# Patient Record
Sex: Female | Born: 1995 | Race: Black or African American | Hispanic: No | Marital: Single | Smoking: Never smoker
Health system: Southern US, Community
[De-identification: ages and names within clinical notes are randomized; demographics above are authoritative.]

## PROBLEM LIST (undated history)

## (undated) ENCOUNTER — Inpatient Hospital Stay: Payer: Self-pay

## (undated) DIAGNOSIS — R569 Unspecified convulsions: Secondary | ICD-10-CM

## (undated) HISTORY — PX: THROAT SURGERY: SHX803

---
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
## 2017-06-17 ENCOUNTER — Emergency Department (HOSPITAL_COMMUNITY)
Admission: EM | Admit: 2017-06-17 | Discharge: 2017-06-17 | Disposition: A | Discharge status: Home / Self Care | Payer: Self-pay | Attending: Emergency Medicine | Admitting: Emergency Medicine

## 2017-06-17 ENCOUNTER — Encounter (HOSPITAL_COMMUNITY): Payer: Self-pay | Admitting: Emergency Medicine

## 2017-06-17 DIAGNOSIS — R21 Rash and other nonspecific skin eruption: Secondary | ICD-10-CM | POA: Insufficient documentation

## 2017-06-17 DIAGNOSIS — M7918 Myalgia, other site: Secondary | ICD-10-CM | POA: Insufficient documentation

## 2017-06-17 DIAGNOSIS — M791 Myalgia, unspecified site: Secondary | ICD-10-CM

## 2017-06-17 DIAGNOSIS — R197 Diarrhea, unspecified: Secondary | ICD-10-CM | POA: Insufficient documentation

## 2017-06-17 LAB — POC URINE PREG, ED: PREG TEST UR: NEGATIVE

## 2017-06-17 MED ORDER — DOXYCYCLINE HYCLATE 100 MG PO CAPS: 100.0000 mg | ORAL_CAPSULE | Freq: Two times a day (BID) | ORAL | 0 refills | Status: DC

## 2017-06-17 NOTE — ED Triage Notes (Signed)
Pt reports generalized muscle pain since yesterday. Took twice in last 24 hours. Last dosage at 6am. Pt denies NV, loose stool this am. Reports possible fever yesterday. Family at bedside. Pt is alert, oriented and ambulatory

## 2017-06-17 NOTE — ED Provider Notes (Signed)
[facility]-EMERGENCY DEPT Provider Note   CSN: [id number] Arrival date & time: 06/17/17  1153     History   Chief Complaint Chief Complaint  Patient presents with  . Generalized Body Aches    HPI Paula Strickland is a 21 y.o. female who presents today with chief complaint acute onset, progressively worsening generalized myalgias since yesterday morning.  She states that she feels an achy soreness all over her body with worsening at her joints, low back, and neck.  She has been taking ibuprofen at home with mild relief.  Endorses subjective fevers at home but is unsure what her temperature was.  One episode of watery loose stools earlier today but denies melena or hematochezia.  Denies abdominal pain, nausea, vomiting, urinary symptoms, or vaginal symptoms.  Denies chest pain or shortness of breath.  She endorses a mild frontal aching headache but denies vision changes, numbness, tingling, or weakness.  No known sick contacts.  Denies suspicious food intake.  No recent treatment with antibiotics.  She did have an insect bite approximately 2 weeks ago to her left ankle which improved.  The history is provided by the patient.    Past Medical History:  Diagnosis Date  . Normal delivery   . Seizures (HCC) 255-19 years old    Patient Active Problem List   Diagnosis Date Noted  . Labor and delivery, indication for care 03/01/2016  . First trimester screening 08/24/2015    Past Surgical History:  Procedure Laterality Date  . THROAT SURGERY  21 years old   pt unsure of procedure that was performed    OB History    Gravida Para Term Preterm AB Living   1 1 1     1    SAB TAB Ectopic Multiple Live Births         0 1      Obstetric Comments   Pt. Here for IOL.        Home Medications    Prior to Admission medications   Medication Sig Start Date End Date Taking? Authorizing Provider  doxycycline (VIBRAMYCIN) 100 MG capsule Take 1 capsule (100 mg total) 2 (two)  times daily for 7 days by mouth. 06/17/17 06/24/17  Michela PitcherFawze, Amarii Amy A, PA-C  ibuprofen (ADVIL,MOTRIN) 200 MG tablet Take 800 mg by mouth every 6 (six) hours as needed (migraine).    [provider]  medroxyPROGESTERone (DEPO-PROVERA) 150 MG/ML injection Inject 1 mL (150 mg total) into the muscle once. 03/04/16 06/29/16  Sharee PimpleJones, Caron W, CNM  prochlorperazine (COMPAZINE) 10 MG tablet Take 1 tablet (10 mg total) by mouth 2 (two) times daily as needed for nausea or vomiting (Nausea ). 06/29/16   Pisciotta, Joni ReiningNicole, PA-C    Family History Family History  Problem Relation Age of Onset  . Migraines Mother   . Hypertension Mother     Social History Social History   Tobacco Use  . Smoking status: Never Smoker  . Smokeless tobacco: Never Used  Substance Use Topics  . Alcohol use: No  . Drug use: No     Allergies   Patient has no known allergies.   Review of Systems Review of Systems  Constitutional: Positive for chills, fatigue and fever.  HENT: Negative for congestion, rhinorrhea, sneezing, sore throat and trouble swallowing.   Eyes: Negative for visual disturbance.  Respiratory: Negative for cough.   Cardiovascular: Negative for chest pain.  Gastrointestinal: Positive for diarrhea. Negative for abdominal pain, blood in stool, constipation, nausea and vomiting.  Genitourinary: Negative for decreased urine volume, dysuria, hematuria, vaginal bleeding, vaginal discharge and vaginal pain.  Musculoskeletal: Positive for arthralgias, back pain, myalgias and neck pain.  Neurological: Positive for headaches. Negative for dizziness, syncope, facial asymmetry, light-headedness and numbness.  All other systems reviewed and are negative.    Physical Exam Updated Vital Signs BP 123/66 (BP Location: Right Arm)   Pulse 99   Temp 98.7 F (37.1 C) (Oral)   Resp 18   Wt 88.5 kg (195 lb)   LMP  (LMP Unknown)   SpO2 100%   Breastfeeding? No Comment: last period 5 months ago  BMI 34.54  kg/m   Physical Exam  Constitutional: She is oriented to person, place, and time. She appears well-developed and well-nourished. No distress.  HENT:  Head: Normocephalic and atraumatic.  Right Ear: Tympanic membrane, external ear and ear canal normal.  Left Ear: Tympanic membrane, external ear and ear canal normal.  Nose: Right sinus exhibits no maxillary sinus tenderness and no frontal sinus tenderness. Left sinus exhibits no maxillary sinus tenderness and no frontal sinus tenderness.  Mouth/Throat: Uvula is midline and oropharynx is clear and moist. No trismus in the jaw. No uvula swelling. No oropharyngeal exudate.  Eyes: Conjunctivae are normal. Pupils are equal, round, and reactive to light. Right eye exhibits no discharge. Left eye exhibits no discharge.  Neck: Normal range of motion. Neck supple. No JVD present. No tracheal deviation present. No thyromegaly present.  No midline spine TTP, mild bilateral para cervical muscle tenderness.  No deformity, crepitus, or step-off noted.  No meningeal signs noted.  Cardiovascular: Normal rate, regular rhythm and normal heart sounds.  Pulmonary/Chest: Effort normal and breath sounds normal.  Abdominal: Soft. Bowel sounds are normal. She exhibits no distension. There is no tenderness.  Musculoskeletal: Normal range of motion. She exhibits no edema or tenderness.  No midline spine TTP, no paraspinal muscle tenderness, no deformity, crepitus, or step-off noted. 5/5 strength in BUE and BLE major muscle groups.   Lymphadenopathy:    She has no cervical adenopathy.  Neurological: She is alert and oriented to person, place, and time. No sensory deficit. She exhibits normal muscle tone.  Fluent speech, no facial droop, sensation intact globally, normal gait, and patient able to heel walk and toe walk without difficulty.   Skin: Skin is warm and dry. Rash noted. No erythema.  Diffuse nonpruritic maculopapular erythematous rash noted to the extremities.   Spares the palms and soles.  Nontender to palpation.  No desquamation or vesicles.  Psychiatric: She has a normal mood and affect. Her behavior is normal.  Nursing note and vitals reviewed.    ED Treatments / Results  Labs (all labs ordered are listed, but only abnormal results are displayed) Labs Reviewed  POC URINE PREG, ED    EKG  EKG Interpretation None       Radiology No results found.  Procedures Procedures (including critical care time)  Medications Ordered in ED Medications - No data to display   Initial Impression / Assessment and Plan / ED Course  I have reviewed the triage vital signs and the nursing notes.  Pertinent labs & imaging results that were available during my care of the patient were reviewed by me and considered in my medical decision making (see chart for details).     Patient with generalized myalgias since yesterday as well as rash which was discovered on physical examination that patient had not noticed.  One episode of diarrhea earlier today.  Afebrile, vital signs are stable, she is nontoxic in appearance.  Abdominal examination is unremarkable.  No focal neurological deficits.  No meningeal signs or fever to suggest meningitis.  Low suspicion of obstruction, perforation, appendicitis, or other acute surgical abdominal pathology.  With history of insect bite and new onset rash with arthralgias and myalgias, [facility] or related infection.  Will discharge with doxycycline.  She will follow-up with her primary care physician for reevaluation.  Discussed indications for return to the ED. Pt verbalized understanding of and agreement with plan and is safe for discharge home at this time.   Final Clinical Impressions(s) / ED Diagnoses   Final diagnoses:  Myalgia  Diarrhea in adult patient  Rash    ED Discharge Orders        Ordered    doxycycline (VIBRAMYCIN) 100 MG capsule  2 times daily     06/17/17 1305         Jeanie SewerFawze, Etheleen Valtierra A, PA-C 06/17/17 2207

## 2017-06-17 NOTE — Discharge Instructions (Signed)
Please take all of your antibiotics until finished!   You may develop abdominal discomfort or diarrhea from the antibiotic.  You may help offset this with probiotics which you can buy or get in yogurt. Do not eat  or take the probiotics until 2 hours after your antibiotic.   Alternate 600 mg of ibuprofen and [phone] mg of Tylenol every 3 hours as needed for pain. Do not exceed 4000 mg of Tylenol daily.  Drink plenty of fluids and get plenty of rest.  Follow-up with a primary care physician for reevaluation.  Return to the ED if any concerning signs or symptoms develop.

## 2017-06-21 ENCOUNTER — Encounter (HOSPITAL_COMMUNITY): Payer: Self-pay | Admitting: Emergency Medicine

## 2017-06-21 ENCOUNTER — Other Ambulatory Visit: Payer: Self-pay

## 2017-06-21 ENCOUNTER — Emergency Department (HOSPITAL_COMMUNITY)
Admission: EM | Admit: 2017-06-21 | Discharge: 2017-06-21 | Disposition: A | Discharge status: Home / Self Care | Payer: Self-pay | Attending: Emergency Medicine | Admitting: Emergency Medicine

## 2017-06-21 DIAGNOSIS — M2550 Pain in unspecified joint: Secondary | ICD-10-CM | POA: Insufficient documentation

## 2017-06-21 DIAGNOSIS — J029 Acute pharyngitis, unspecified: Secondary | ICD-10-CM | POA: Insufficient documentation

## 2017-06-21 DIAGNOSIS — Z79899 Other long term (current) drug therapy: Secondary | ICD-10-CM | POA: Insufficient documentation

## 2017-06-21 DIAGNOSIS — D649 Anemia, unspecified: Secondary | ICD-10-CM | POA: Insufficient documentation

## 2017-06-21 DIAGNOSIS — E876 Hypokalemia: Secondary | ICD-10-CM | POA: Insufficient documentation

## 2017-06-21 DIAGNOSIS — R197 Diarrhea, unspecified: Secondary | ICD-10-CM

## 2017-06-21 DIAGNOSIS — R21 Rash and other nonspecific skin eruption: Secondary | ICD-10-CM | POA: Insufficient documentation

## 2017-06-21 LAB — CBC WITH DIFFERENTIAL/PLATELET
BASOS ABS: 0 10*3/uL (ref 0.0–0.1)
Basophils Relative: 0 %
Eosinophils Absolute: 0.1 10*3/uL (ref 0.0–0.7)
Eosinophils Relative: 1 %
HEMATOCRIT: 34.3 % — AB (ref 36.0–46.0)
HEMOGLOBIN: 11.4 g/dL — AB (ref 12.0–15.0)
Lymphocytes Relative: 24 %
Lymphs Abs: 2.1 10*3/uL (ref 0.7–4.0)
MCH: 27.4 pg (ref 26.0–34.0)
MCHC: 33.2 g/dL (ref 30.0–36.0)
MCV: 82.5 fL (ref 78.0–100.0)
Monocytes Absolute: 0.3 10*3/uL (ref 0.1–1.0)
Monocytes Relative: 4 %
NEUTROS ABS: 6.2 10*3/uL (ref 1.7–7.7)
NEUTROS PCT: 71 %
Platelets: 357 10*3/uL (ref 150–400)
RBC: 4.16 MIL/uL (ref 3.87–5.11)
RDW: 12.8 % (ref 11.5–15.5)
WBC: 8.7 10*3/uL (ref 4.0–10.5)

## 2017-06-21 LAB — COMPREHENSIVE METABOLIC PANEL
ALBUMIN: 4 g/dL (ref 3.5–5.0)
ALK PHOS: 100 U/L (ref 38–126)
ALT: 18 U/L (ref 14–54)
AST: 22 U/L (ref 15–41)
Anion gap: 9 (ref 5–15)
BILIRUBIN TOTAL: 0.3 mg/dL (ref 0.3–1.2)
BUN: 12 mg/dL (ref 6–20)
CO2: 27 mmol/L (ref 22–32)
Calcium: 9.2 mg/dL (ref 8.9–10.3)
Chloride: 105 mmol/L (ref 101–111)
Creatinine, Ser: 0.61 mg/dL (ref 0.44–1.00)
GFR calc Af Amer: >60 mL/min (ref >60)
GFR calc non Af Amer: >60 mL/min (ref >60)
GLUCOSE: 109 mg/dL — AB (ref 65–99)
Potassium: 3.2 mmol/L — ABNORMAL LOW (ref 3.5–5.1)
SODIUM: 141 mmol/L (ref 135–145)
TOTAL PROTEIN: 7.5 g/dL (ref 6.5–8.1)

## 2017-06-21 LAB — URINALYSIS, ROUTINE W REFLEX MICROSCOPIC
Bilirubin Urine: NEGATIVE
Glucose, UA: NEGATIVE mg/dL
Hgb urine dipstick: NEGATIVE
KETONES UR: NEGATIVE mg/dL
Nitrite: NEGATIVE
PH: 6 (ref 5.0–8.0)
PROTEIN: NEGATIVE mg/dL
Specific Gravity, Urine: 1.023 (ref 1.005–1.030)

## 2017-06-21 LAB — RAPID STREP SCREEN (MED CTR MEBANE ONLY): STREPTOCOCCUS, GROUP A SCREEN (DIRECT): NEGATIVE

## 2017-06-21 LAB — I-STAT BETA HCG BLOOD, ED (MC, WL, AP ONLY)

## 2017-06-21 LAB — MONONUCLEOSIS SCREEN: MONO SCREEN: NEGATIVE

## 2017-06-21 MED ORDER — DOXYCYCLINE HYCLATE 100 MG PO CAPS: 100.0000 mg | ORAL_CAPSULE | Freq: Two times a day (BID) | ORAL | 0 refills | Status: DC

## 2017-06-21 MED ORDER — POTASSIUM CHLORIDE CRYS ER 20 MEQ PO TBCR
40.0000 meq | EXTENDED_RELEASE_TABLET | Freq: Once | ORAL | Status: AC
  Administered 2017-06-21: 40 meq via ORAL
  Filled 2017-06-21: qty 2

## 2017-06-21 NOTE — Discharge Instructions (Addendum)
Your labs today were reassuring except for a slightly low potassium level, for which you were given potassium here; see the list of foods below that have potassium in them and try to eat more of those foods.  Continue to stay well-hydrated. Gargle warm salt water and spit it out. Use chloraseptic spray as needed for sore throat. Continue to alternate between Tylenol and Ibuprofen for pain or fever. Use Mucinex for cough suppression/expectoration of mucus. Use netipot and flonase to help with nasal congestion. May consider over-the-counter Benadryl or other antihistamine to decrease secretions and for help with your symptoms. TAKE THE ANTIBIOTIC AS DIRECTED UNTIL COMPLETED, avoid direct sunlight exposure while taking the antibiotic; always take it on a full stomach; may cause GI discomfort, take with yogurt to improve that issue.  You may consider using over the counter tums, maalox, pepto bismol, or other over the counter remedies to help with diarrhea symptoms. Stay well hydrated with small sips of fluids throughout the day. Follow a BRAT (banana-rice-applesauce-toast) diet as described below for the next 24-48 hours. The 'BRAT' diet is suggested, then progress to diet as tolerated as symptoms abate. Call your regular doctor if bloody stools, persistent diarrhea, vomiting, fever or abdominal pain.  You were tested for HIV, syphilis, gonorrhea, and chlamydia, and the lab will call you if any of these are abnormal. Do not engage in sexual activity until you find out about your results. Make sure all partners are tested and treated before re-engaging in sexual activity afterwards. Follow up with the health department for any future STD concerns/treatment/testing/etc.  Follow up with your regular doctor in 5-7 days for recheck of symptoms and ongoing evaluation of your complaints. Return to emergency department for emergent changing or worsening of symptoms.

## 2017-06-21 NOTE — ED Triage Notes (Signed)
Patient complaining of swelling in hands, feet, and knees. Patient states this has been going on since Monday. Patient was seen for the same thing on 06/17/2017. Patient states she has not did a follow up. Patient states the pain hasnt gotten better.

## 2017-06-21 NOTE — ED Provider Notes (Signed)
[facility] DEPT Provider Note   CSN: [id number] Arrival date & time: 06/21/17  1926     History   Chief Complaint Chief Complaint  Patient presents with  . Joint Swelling    HPI Paula Strickland is a 21 y.o. female with a PMHx of seizures, who presents to the ED with complaints of ongoing generalized joint pain and swelling x6 days. Per chart review, pt was seen in the ED on 06/17/17 for same complaints, was noted to have a diffuse nonpruritic maculopapular rash and had recent insect bite, so she was empirically treated for RMSF with doxycycline 160m BID x7 days and advised to f/up with her PCP. She states that she was unaware that she was supposed to start taking the antibiotic, thought that she was supposed to be using an over-the-counter antibiotic ointment, so she has not started the doxycycline yet.  She states that her symptoms have not improved so she came for repeat evaluation.  Symptoms include generalized joint pain and swelling, intermittent nonpruritic nonpainful erythematous macular rash to b/l lower legs (improving), sore throat, diarrhea/looser than normal stool once daily which is nonbloody and not watery, and 1 episode of subjective fever/chills on Monday (day of onset) but none since then.  She feels as though her symptoms worsen after sleeping because they seem to be worse in the morning.  She has tried ibuprofen and increased fluids with no relief of her symptoms.  She has not had any tick bites recently, but states that she had a mosquito bite on her left ankle 2 weeks ago.  Has not been outside or recently traveled.  No known sick contacts.  She denies any eye pain/redness/drainage, rhinorrhea, trismus, drooling, ear pain or drainage, cough, chest pain, shortness of breath, abdominal pain, nausea, vomiting, constipation, melena, hematochezia, obstipation, dysuria, hematuria, vaginal bleeding or discharge, numbness, tingling, focal weakness, or any  other complaints at this time.   The history is provided by the patient and medical records. No language interpreter was used.  Illness  This is a new problem. The current episode started more than 2 days ago. The problem occurs constantly. The problem has not changed since onset.Pertinent negatives include no chest pain, no abdominal pain and no shortness of breath. Exacerbated by: sleeping. Nothing relieves the symptoms. She has tried water (fluids and ibuprofen) for the symptoms. The treatment provided no relief.    Past Medical History:  Diagnosis Date  . Normal delivery   . Seizures ([place] [age]years old    Patient Active Problem List   Diagnosis Date Noted  . Labor and delivery, indication for care 03/01/2016  . First trimester screening 08/24/2015    Past Surgical History:  Procedure Laterality Date  . THROAT SURGERY  21years old   pt unsure of procedure that was performed    OB History    Gravida Para Term Preterm AB Living   1 1 1     1    SAB TAB Ectopic Multiple Live Births         0 1      Obstetric Comments   Pt. Here for IOL.        Home Medications    Prior to Admission medications   Medication Sig Start Date End Date Taking? Authorizing Provider  doxycycline (VIBRAMYCIN) 100 MG capsule Take 1 capsule (100 mg total) 2 (two) times daily for 7 days by mouth. 06/17/17 06/24/17  FRodell PernaA, PA-C  ibuprofen (ADVIL,MOTRIN) 200 MG  tablet Take 800 mg by mouth every 6 (six) hours as needed (migraine).    [provider]  medroxyPROGESTERone (DEPO-PROVERA) 150 MG/ML injection Inject 1 mL (150 mg total) into the muscle once. 03/04/16 06/29/16  Catheryn Bacon, CNM  prochlorperazine (COMPAZINE) 10 MG tablet Take 1 tablet (10 mg total) by mouth 2 (two) times daily as needed for nausea or vomiting (Nausea ). 06/29/16   Pisciotta, Elmyra Ricks, PA-C    Family History Family History  Problem Relation Age of Onset  . Migraines Mother   . Hypertension Mother      Social History Social History   Tobacco Use  . Smoking status: Never Smoker  . Smokeless tobacco: Never Used  Substance Use Topics  . Alcohol use: No  . Drug use: No     Allergies   Patient has no known allergies.   Review of Systems Review of Systems  Constitutional: Positive for chills and fever (subjective, once on monday, none since).  HENT: Positive for sore throat. Negative for drooling, ear discharge, ear pain, rhinorrhea and trouble swallowing.   Eyes: Negative for pain, discharge and redness.  Respiratory: Negative for cough and shortness of breath.   Cardiovascular: Negative for chest pain.  Gastrointestinal: Positive for diarrhea (1x/day looser than normal but not watery). Negative for abdominal pain, blood in stool, constipation, nausea and vomiting.  Genitourinary: Negative for dysuria, hematuria, vaginal bleeding and vaginal discharge.  Musculoskeletal: Positive for arthralgias, joint swelling and myalgias.  Skin: Positive for rash.  Allergic/Immunologic: Negative for immunocompromised state.  Neurological: Negative for weakness and numbness.  Psychiatric/Behavioral: Negative for confusion.   All other systems reviewed and are negative for acute change except as noted in the HPI.    Physical Exam Updated Vital Signs BP 122/77 (BP Location: Right Arm)   Pulse (!) 101   Temp 97.9 F (36.6 C) (Oral)   Resp 16   Ht 5' 3"  (1.6 m)   Wt 88.5 kg (195 lb)   LMP  (LMP Unknown)   SpO2 100%   BMI 34.54 kg/m   Physical Exam  Constitutional: She is oriented to person, place, and time. Vital signs are normal. She appears well-developed and well-nourished.  Non-toxic appearance. No distress.  Afebrile, nontoxic, NAD  HENT:  Head: Normocephalic and atraumatic.  Nose: Nose normal.  Mouth/Throat: Uvula is midline and mucous membranes are normal. No trismus in the jaw. No uvula swelling. Posterior oropharyngeal erythema present. No oropharyngeal exudate, posterior  oropharyngeal edema or tonsillar abscesses. Tonsils are 0 on the right. Tonsils are 0 on the left. No tonsillar exudate.  Nose clear. Oropharynx mildly injected, without uvular swelling or deviation, no trismus or drooling, no tonsillar swelling, no exudates. No PTA   Eyes: Conjunctivae and EOM are normal. Pupils are equal, round, and reactive to light. Right eye exhibits no discharge. Left eye exhibits no discharge.  No conjunctival injection or ocular drainage  Neck: Normal range of motion. Neck supple. No spinous process tenderness and no muscular tenderness present. No neck rigidity. Normal range of motion present.  FROM intact without spinous process TTP, no bony stepoffs or deformities, no paraspinous muscle TTP or muscle spasms. No rigidity or meningeal signs. No bruising or swelling.   Cardiovascular: Normal rate, regular rhythm, normal heart sounds and intact distal pulses. Exam reveals no gallop and no friction rub.  No murmur heard. Pulmonary/Chest: Effort normal and breath sounds normal. No respiratory distress. She has no decreased breath sounds. She has no wheezes. She has  no rhonchi. She has no rales.  Abdominal: Soft. Normal appearance and bowel sounds are normal. She exhibits no distension. There is no tenderness. There is no rigidity, no rebound, no guarding, no CVA tenderness, no tenderness at McBurney's point and negative Murphy's sign.  Musculoskeletal: Normal range of motion.  Slightly puffy appearance to hands and knees bilaterally, but no focal joint swelling/effusion and no overlying erythema to joints. No focal bony TTP. FROM intact in all major joints of entire body. Strength and sensation grossly intact, distal pulses intact, compartments soft.   Neurological: She is alert and oriented to person, place, and time. She has normal strength. No sensory deficit.  Skin: Skin is warm, dry and intact. Rash noted. Rash is macular.  Faint erythematous macular rash to b/l lower legs,  nonpruritic and nontender, does not blanch, no nodules, no janeway lesions or splinter hemorrhages. No osler nodes. SEE PICTURE BELOW  Psychiatric: She has a normal mood and affect.  Nursing note and vitals reviewed.        ED Treatments / Results  Labs (all labs ordered are listed, but only abnormal results are displayed) Labs Reviewed  CBC WITH DIFFERENTIAL/PLATELET - Abnormal; Notable for the following components:      Result Value   Hemoglobin 11.4 (*)    HCT 34.3 (*)    All other components within normal limits  COMPREHENSIVE METABOLIC PANEL - Abnormal; Notable for the following components:   Potassium 3.2 (*)    Glucose, Bld 109 (*)    All other components within normal limits  URINALYSIS, ROUTINE W REFLEX MICROSCOPIC - Abnormal; Notable for the following components:   APPearance HAZY (*)    Leukocytes, UA LARGE (*)    Bacteria, UA RARE (*)    Squamous Epithelial / LPF 6-30 (*)    All other components within normal limits  RAPID STREP SCREEN (NOT AT [facility])  CULTURE, GROUP A STREP ([facility])  MONONUCLEOSIS SCREEN  RPR  HIV ANTIBODY (ROUTINE TESTING)  I-STAT BETA HCG BLOOD, ED ([facility], [facility], [facility] ONLY)  GC/CHLAMYDIA PROBE AMP ([place]) NOT AT [facility]    EKG  EKG Interpretation None       Radiology No results found.  Procedures Procedures (including critical care time)  Medications Ordered in ED Medications  potassium chloride SA (K-DUR,KLOR-CON) CR tablet 40 mEq (40 mEq Oral Given 06/21/17 2239)     Initial Impression / Assessment and Plan / ED Course  I have reviewed the triage vital signs and the nursing notes.  Pertinent labs & imaging results that were available during my care of the patient were reviewed by me and considered in my medical decision making (see chart for details).     21 y.o. female here with ongoing joint pain/swelling, intermittent rash, diarrhea, and sore throat x6 days, and one episode of subjective fever at onset but none since; was  seen 5 days ago for same and rx'd doxycycline for empiric tx of RMSF but never filled it because she thought she was just supposed to be using an antibiotic cream. On exam, b/l lower legs with faint erythematous macular rash which is nontender and nonpruritic, mild oropharynx injection but no tonsillar swelling or exudate, hands and knees look slightly puffy but hard to say since I don't know what her baseline is; no focal joint swelling or erythema to one specific area; abdomen soft and nontender, afebrile and nontoxic appearing, no meningeal signs. RMSF vs Lyme disease is possible; reiter's syndrome vs strep reactive arthritis also  possibilities; vs viral illness vs nephrotic syndrome (although less likely). Very odd myriad of symptoms/signs. Will obtain labs including CBC w/diff, CMP, STD panel, U/A, RST, mono test, and betaHCG. Will reassess shortly.   10:51 PM CBC w/diff with mild stable anemia but otherwise unremarkable. CMP with mildly low K 3.2, will replete here, but otherwise unremarkable. U/A grossly contaminated but without compelling evidence of UTI. BetaHCG neg. RST negative. Mono screen negative. With reassuring work up and overall reassuring exam, doubt acute emergent pathology or need for further emergent work up; could be viral pathology; could still be lyme/RMSF, so will have her take the doxycycline rx she was previously given (will provide script now just in case she doesn't have it at home, but discussed that she doesn't need to take BOTH, just one of the scripts). Advised BRAT diet, tylenol/motrin for pain, adequate hydration, rest, and other OTC remedies for symptomatic relief. Advised abstinence until STD testing returns, and having partners tested/treated before re-engaging in sexual intercourse after that; f/up with health dept for future STD concerns/treatment/testing/etc. F/up with PCP in 5-7 days for recheck. Strict return precautions advised. Discussed case with my attending Dr.  Zenia Resides who agrees with plan.  I explained the diagnosis and have given explicit precautions to return to the ER including for any other new or worsening symptoms. The patient understands and accepts the medical plan as it's been dictated and I have answered their questions. Discharge instructions concerning home care and prescriptions have been given. The patient is STABLE and is discharged to home in good condition.    Final Clinical Impressions(s) / ED Diagnoses   Final diagnoses:  Generalized joint pain  Rash  Sore throat  Diarrhea, unspecified type  Chronic anemia  Hypokalemia    ED Discharge Orders        Ordered    doxycycline (VIBRAMYCIN) 100 MG capsule  2 times daily     06/21/17 [address] [place], [place] 06/21/17 2254    Lacretia Leigh, MD 06/23/17 [id number]

## 2017-06-23 LAB — GC/CHLAMYDIA PROBE AMP (~~LOC~~) NOT AT ARMC
Chlamydia: NEGATIVE
NEISSERIA GONORRHEA: NEGATIVE

## 2017-06-23 LAB — RPR: RPR Ser Ql: NONREACTIVE

## 2017-06-23 LAB — HIV ANTIBODY (ROUTINE TESTING W REFLEX): HIV SCREEN 4TH GENERATION: NONREACTIVE

## 2017-06-23 IMAGING — CT CT HEAD W/O CM
3 of 4 series · 14 of 47 positions shown, 16 images · non-contrast
Comparison: None.

CLINICAL DATA: Headaches since this morning

EXAM:
CT HEAD WITHOUT CONTRAST
TECHNIQUE: Contiguous axial images were obtained from the base of the skull
through the vertex without intravenous contrast.

[Series 2: head w/o · axial · non-contrast · 0.45mm/px · z∈[+158,+278]mm · 8 of 28 slices shown, 10 images]
[im 2/28  brain]
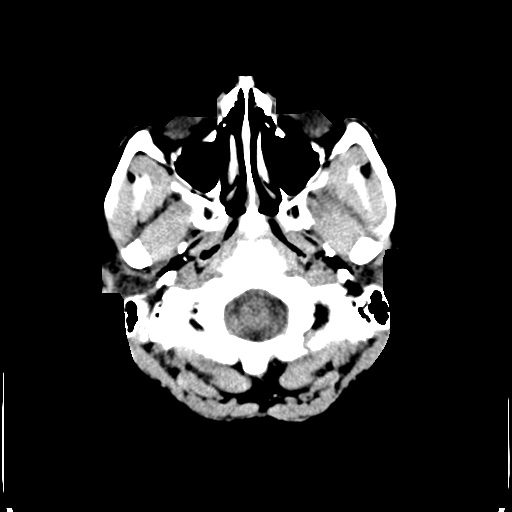
[im 2/28  bone]
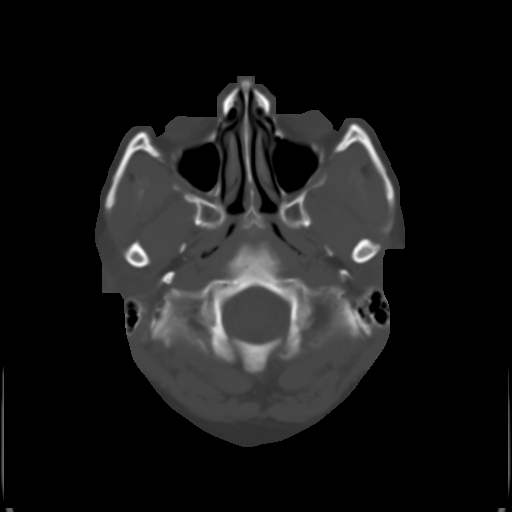
[im 6/28  brain]
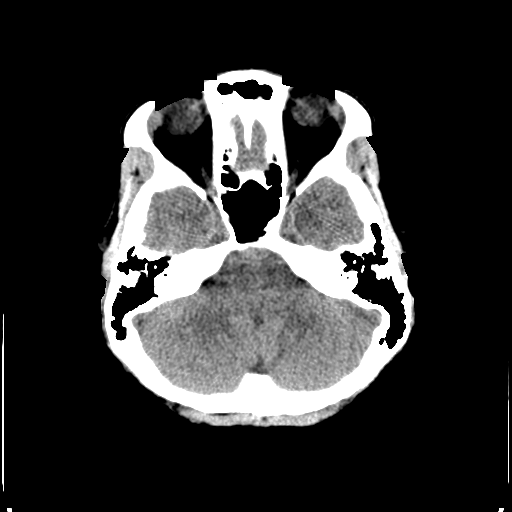
[im 10/28  brain]
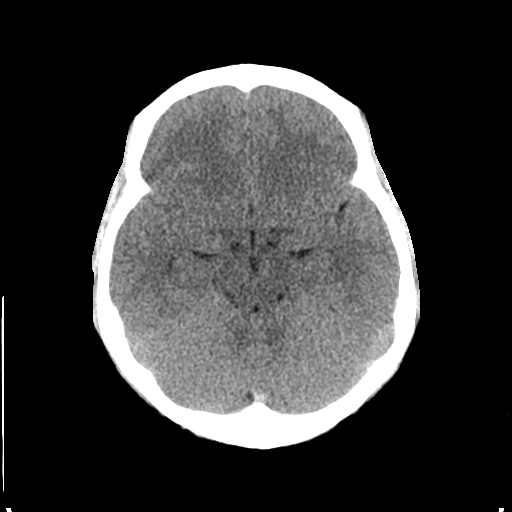
[im 12/28  brain]
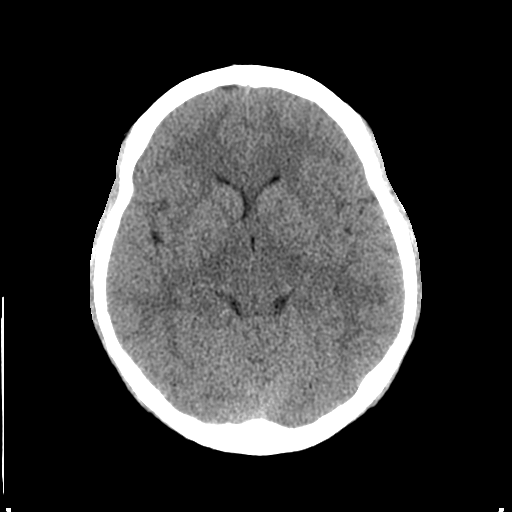
[im 16/28  brain]
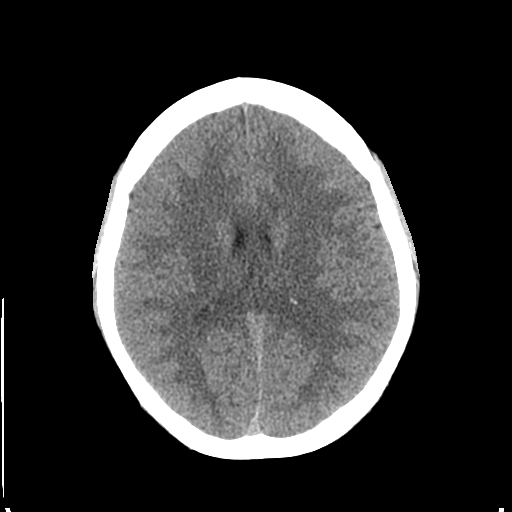
[im 16/28  bone]
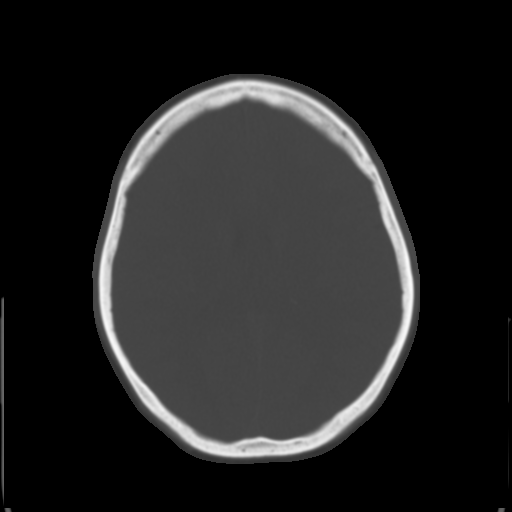
[im 18/28  brain]
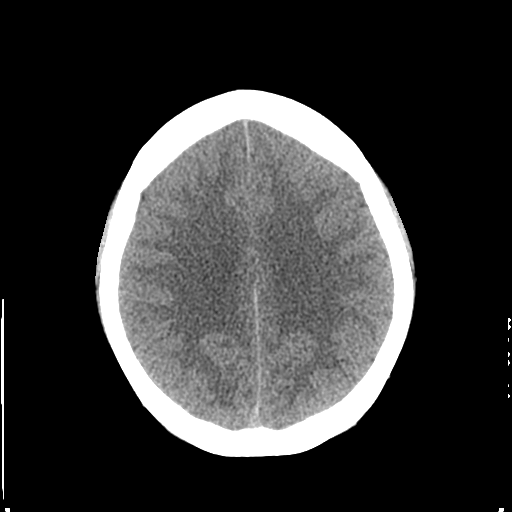
[im 22/28  brain]
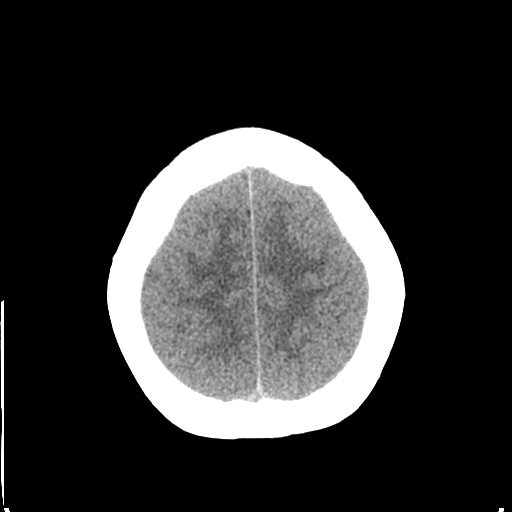
[im 26/28  brain]
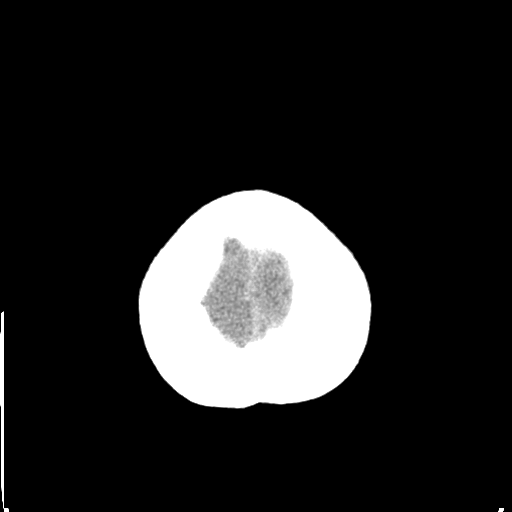

[Series 4: coronal · coronal · 0.26mm/px · 3 of 69 slices shown]
[im 23/69  brain]
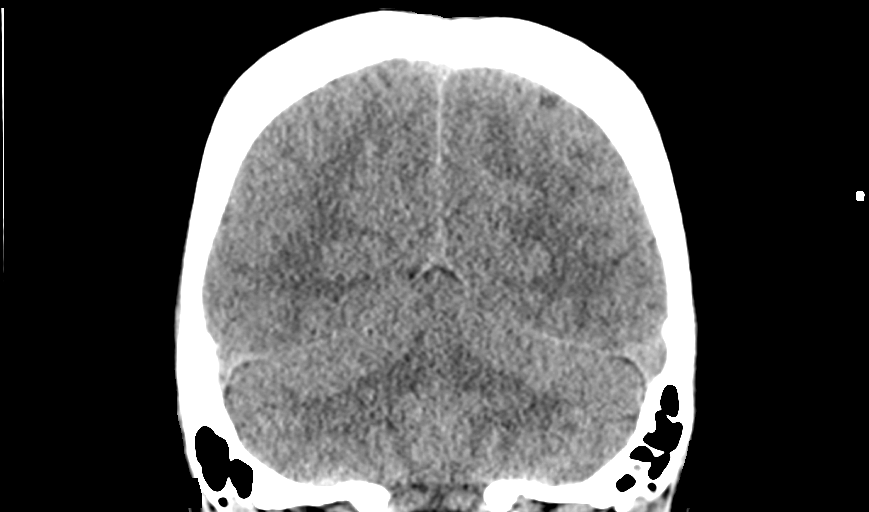
[im 31/69  brain]
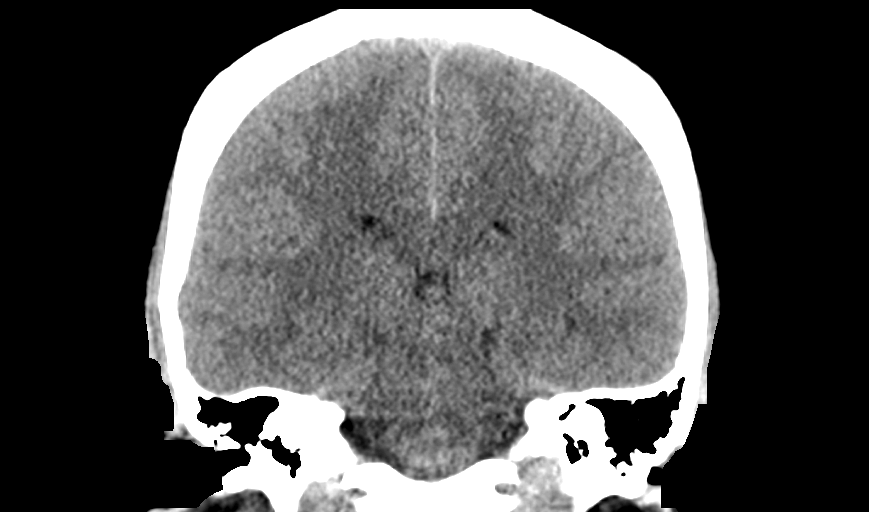
[im 38/69  brain]
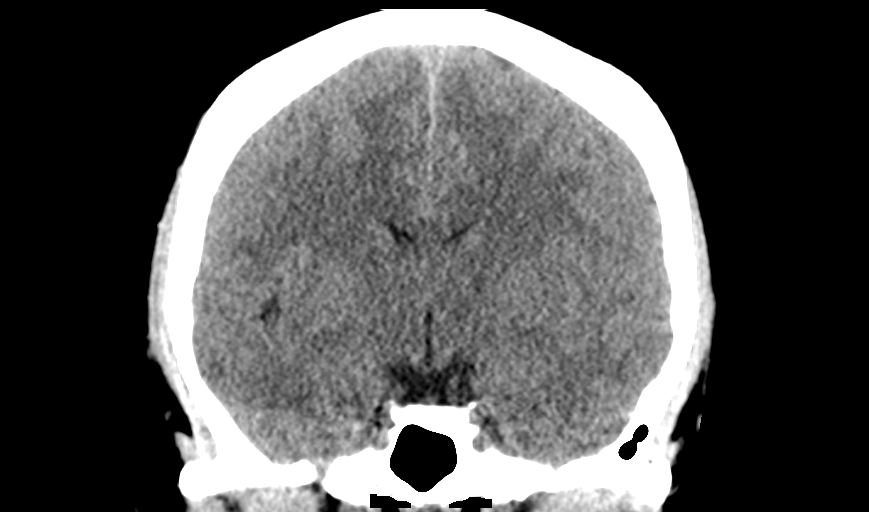

[Series 5: sagittal · sagittal · 0.31mm/px · 3 of 60 slices shown]
[im 20/60  brain]
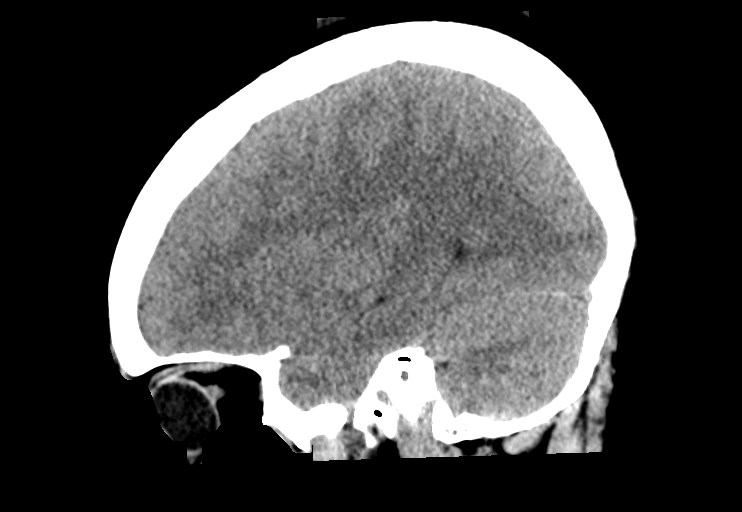
[im 30/60  brain]
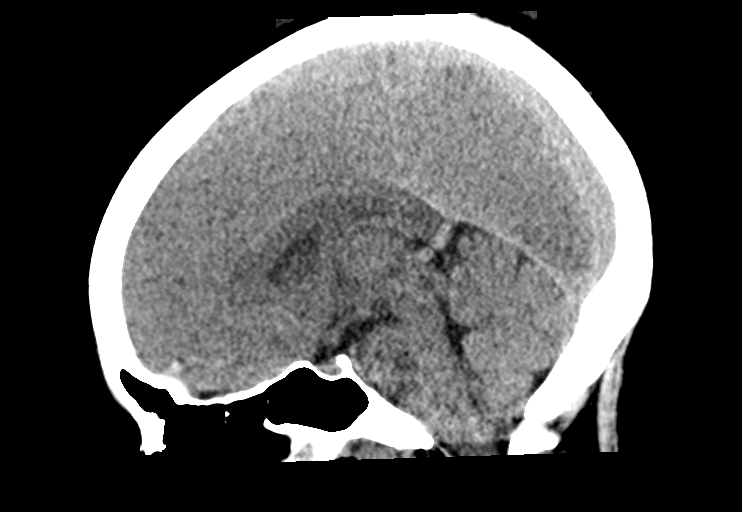
[im 40/60  brain]
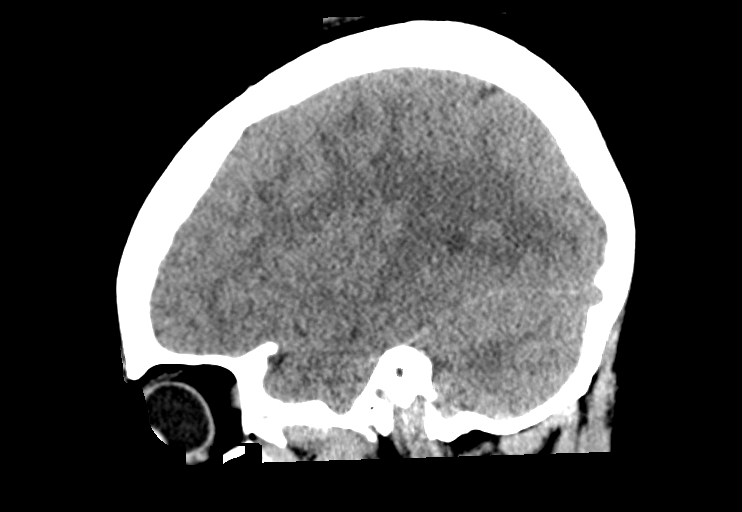

[14 of 47 positions shown; findings below may reference images not displayed]

FINDINGS: Brain: No acute intracranial abnormality. Specifically, no
hemorrhage, hydrocephalus, mass lesion, acute infarction, or
significant intracranial injury.

Vascular: No hyperdense vessel or unexpected calcification.

Skull: No acute calvarial abnormality.

Sinuses/Orbits: Visualized paranasal sinuses and mastoids clear.
Orbital soft tissues unremarkable.

Other: None
IMPRESSION: Normal study.

## 2017-06-24 LAB — CULTURE, GROUP A STREP (THRC)

## 2019-03-28 ENCOUNTER — Encounter (HOSPITAL_COMMUNITY): Payer: Self-pay

## 2019-03-28 ENCOUNTER — Emergency Department (HOSPITAL_COMMUNITY)
Admission: EM | Admit: 2019-03-28 | Discharge: 2019-03-28 | Disposition: A | Discharge status: Home / Self Care | Payer: Medicaid Other | Attending: Emergency Medicine | Admitting: Emergency Medicine

## 2019-03-28 DIAGNOSIS — T22212A Burn of second degree of left forearm, initial encounter: Secondary | ICD-10-CM | POA: Insufficient documentation

## 2019-03-28 DIAGNOSIS — Y999 Unspecified external cause status: Secondary | ICD-10-CM | POA: Insufficient documentation

## 2019-03-28 DIAGNOSIS — Y929 Unspecified place or not applicable: Secondary | ICD-10-CM | POA: Insufficient documentation

## 2019-03-28 DIAGNOSIS — S59912A Unspecified injury of left forearm, initial encounter: Secondary | ICD-10-CM | POA: Diagnosis present

## 2019-03-28 DIAGNOSIS — Y93G3 Activity, cooking and baking: Secondary | ICD-10-CM | POA: Diagnosis not present

## 2019-03-28 DIAGNOSIS — X118XXA Contact with other hot tap-water, initial encounter: Secondary | ICD-10-CM | POA: Insufficient documentation

## 2019-03-28 MED ORDER — SILVER SULFADIAZINE 1 % EX CREA
TOPICAL_CREAM | Freq: Once | CUTANEOUS | Status: AC
  Administered 2019-03-28: 11:00:00 via TOPICAL
  Filled 2019-03-28: qty 50

## 2019-03-28 MED ORDER — SILVER SULFADIAZINE 1 % EX CREA: 1.0000 "application " | TOPICAL_CREAM | Freq: Every day | CUTANEOUS | 0 refills | Status: DC

## 2019-03-28 NOTE — ED Provider Notes (Signed)
[facility] DEPT Provider Note   CSN: [id number] Arrival date & time: 03/28/19  1004    History   Chief Complaint Chief Complaint  Patient presents with  . Burn    HPI Paula Strickland is a 23 y.o. female.     HPI Patient presents to the ER for evaluation of a burn.  Patient was cooking last evening and accidentally spilled hot water onto her left forearm.  Patient applied ice and cool water.  This morning when she woke up she saw that she developed a blister type lesion at the area of the burn.  She denies any other injuries have been them to the focal area.  She denies any distal numbness or weakness.  No other complaints. Past Medical History:  Diagnosis Date  . Normal delivery   . Seizures ([place]) 51-55 years old    Patient Active Problem List   Diagnosis Date Noted  . Labor and delivery, indication for care 03/01/2016  . First trimester screening 08/24/2015    Past Surgical History:  Procedure Laterality Date  . THROAT SURGERY  23 years old   pt unsure of procedure that was performed     OB History    Gravida  1   Para  1   Term  1   Preterm      AB      Living  1     SAB      TAB      Ectopic      Multiple  0   Live Births  1        Obstetric Comments  Pt. Here for IOL.          Home Medications    Prior to Admission medications   Medication Sig Start Date End Date Taking? Authorizing Provider  doxycycline (VIBRAMYCIN) 100 MG capsule Take 1 capsule (100 mg total) 2 (two) times daily by mouth. One po bid x 7 days 06/21/17   Street, [place], [place]  ibuprofen (ADVIL,MOTRIN) 200 MG tablet Take 800 mg by mouth every 6 (six) hours as needed (migraine).    [provider]  medroxyPROGESTERone (DEPO-PROVERA) 150 MG/ML injection Inject 1 mL (150 mg total) into the muscle once. 03/04/16 06/29/16  Catheryn Bacon, CNM  prochlorperazine (COMPAZINE) 10 MG tablet Take 1 tablet (10 mg total) by mouth 2 (two) times  daily as needed for nausea or vomiting (Nausea ). 06/29/16   Pisciotta, Elmyra Ricks, PA-C  silver sulfADIAZINE (SILVADENE) 1 % cream Apply 1 application topically daily. 03/28/19   Dorie Rank, MD    Family History Family History  Problem Relation Age of Onset  . Migraines Mother   . Hypertension Mother     Social History Social History   Tobacco Use  . Smoking status: Never Smoker  . Smokeless tobacco: Never Used  Substance Use Topics  . Alcohol use: No  . Drug use: No     Allergies   Patient has no known allergies.   Review of Systems Review of Systems  All other systems reviewed and are negative.    Physical Exam Updated Vital Signs BP 127/81 (BP Location: Right Arm)   Pulse 84   Temp 98.4 F (36.9 C) (Oral)   Resp 16   Ht 1.6 m (5\' 3" )   Wt 85.3 kg   LMP 03/28/2019   SpO2 98%   BMI 33.30 kg/m   Physical Exam Vitals signs and nursing note reviewed.  Constitutional:  General: She is not in acute distress.    Appearance: She is well-developed.  HENT:     Head: Normocephalic and atraumatic.     Right Ear: External ear normal.     Left Ear: External ear normal.  Eyes:     General: No scleral icterus.       Right eye: No discharge.        Left eye: No discharge.     Conjunctiva/sclera: Conjunctivae normal.  Neck:     Musculoskeletal: Neck supple.     Trachea: No tracheal deviation.  Cardiovascular:     Rate and Rhythm: Normal rate.  Pulmonary:     Effort: Pulmonary effort is normal. No respiratory distress.     Breath sounds: No stridor.  Abdominal:     General: There is no distension.  Musculoskeletal:        General: No swelling or deformity.  Skin:    General: Skin is warm and dry.     Findings: Burn present. No rash.     Comments: irregular oval shaped area  approx 10 by 5 cm of partial thickness burn, some blistering, left forearm, nl sensation  Neurological:     Mental Status: She is alert.     Cranial Nerves: Cranial nerve deficit: no  gross deficits.      ED Treatments / Results  Labs (all labs ordered are listed, but only abnormal results are displayed) Labs Reviewed - No data to display  EKG None  Radiology No results found.  Procedures Procedures (including critical care time)  Medications Ordered in ED Medications  silver sulfADIAZINE (SILVADENE) 1 % cream (has no administration in time range)     Initial Impression / Assessment and Plan / ED Course  I have reviewed the triage vital signs and the nursing notes.  Pertinent labs & imaging results that were available during my care of the patient were reviewed by me and considered in my medical decision making (see chart for details).   Partial thickness burn.   Silvadene and sterile dressing applied.  Discussed local wound care, burn management.  Final Clinical Impressions(s) / ED Diagnoses   Final diagnoses:  Partial thickness burn of left forearm, initial encounter    ED Discharge Orders         Ordered    silver sulfADIAZINE (SILVADENE) 1 % cream  Daily     03/28/19 1036           Linwood DibblesKnapp, Dacian Orrico, MD 03/28/19 1039

## 2019-03-28 NOTE — ED Triage Notes (Signed)
Pt was in kitchen with boyfriend last night making noodles in the microwave. Pt accidentally hit into the hot water and got burn on left forearm, just below the elbow.

## 2019-03-28 NOTE — Discharge Instructions (Signed)
Apply the antibiotic cream/ointment to your forearm daily.  You can use either the Silvadene or an over-the-counter antibacterial ointment.  Make sure to keep the wound covered daily.  Monitor for signs of infection such as increasing redness , pain or fever.

## 2019-05-02 ENCOUNTER — Emergency Department (HOSPITAL_COMMUNITY)
Admission: EM | Admit: 2019-05-02 | Discharge: 2019-05-02 | Disposition: A | Discharge status: Home / Self Care | Payer: Medicaid Other | Attending: Emergency Medicine | Admitting: Emergency Medicine

## 2019-05-02 ENCOUNTER — Encounter (HOSPITAL_COMMUNITY): Payer: Self-pay

## 2019-05-02 ENCOUNTER — Other Ambulatory Visit: Payer: Self-pay

## 2019-05-02 DIAGNOSIS — L0501 Pilonidal cyst with abscess: Secondary | ICD-10-CM

## 2019-05-02 MED ORDER — DOXYCYCLINE HYCLATE 100 MG PO CAPS: 100.0000 mg | ORAL_CAPSULE | Freq: Two times a day (BID) | ORAL | 0 refills | Status: DC

## 2019-05-02 MED ORDER — LIDOCAINE-EPINEPHRINE (PF) 2 %-1:200000 IJ SOLN
10.0000 mL | Freq: Once | INTRAMUSCULAR | Status: AC
  Administered 2019-05-02: 10 mL
  Filled 2019-05-02: qty 10

## 2019-05-02 NOTE — ED Provider Notes (Signed)
[facility]-EMERGENCY DEPT Provider Note   CSN: [id number] Arrival date & time: 05/02/19  0944     History   Chief Complaint No chief complaint on file.   HPI Glynn OctaveKiara Schreifels is a 23 y.o. female.     Glynn OctaveKiara Ou is a 23 y.o. female with a history of seizures, otherwise healthy, who presents to the emergency department for evaluation of redness pain and swelling that started about 5 to 6 days ago just above the gluteal cleft.  She reports no prior history of the same, initially thought this might of been a spider bite although has not seen any insects.  She reports that it started as a small red painful area and has increased in size over the past 5 days.  She has not noted any drainage from the area.  She denies pain with bowel movements.  But reports pain with sitting and palpation over the area.  No fevers or chills, no nausea or vomiting.  Reports history of 1 previous abscess on her leg but none otherwise.  No other aggravating or alleviating factors.     Past Medical History:  Diagnosis Date  . Normal delivery   . Seizures (HCC) 455-23 years old    Patient Active Problem List   Diagnosis Date Noted  . Labor and delivery, indication for care 03/01/2016  . First trimester screening 08/24/2015    Past Surgical History:  Procedure Laterality Date  . THROAT SURGERY  23 years old   pt unsure of procedure that was performed     OB History    Gravida  1   Para  1   Term  1   Preterm      AB      Living  1     SAB      TAB      Ectopic      Multiple  0   Live Births  1        Obstetric Comments  Pt. Here for IOL.          Home Medications    Prior to Admission medications   Medication Sig Start Date End Date Taking? Authorizing Provider  doxycycline (VIBRAMYCIN) 100 MG capsule Take 1 capsule (100 mg total) 2 (two) times daily by mouth. One po bid x 7 days 06/21/17   Street, WildwoodMercedes, [place]PA-C  ibuprofen (ADVIL,MOTRIN) 200 MG tablet  Take 800 mg by mouth every 6 (six) hours as needed (migraine).    [provider]  medroxyPROGESTERone (DEPO-PROVERA) 150 MG/ML injection Inject 1 mL (150 mg total) into the muscle once. 03/04/16 06/29/16  Sharee PimpleJones, Caron W, CNM  prochlorperazine (COMPAZINE) 10 MG tablet Take 1 tablet (10 mg total) by mouth 2 (two) times daily as needed for nausea or vomiting (Nausea ). 06/29/16   Pisciotta, Joni ReiningNicole, PA-C  silver sulfADIAZINE (SILVADENE) 1 % cream Apply 1 application topically daily. 03/28/19   Linwood DibblesKnapp, Jon, MD    Family History Family History  Problem Relation Age of Onset  . Migraines Mother   . Hypertension Mother     Social History Social History   Tobacco Use  . Smoking status: Never Smoker  . Smokeless tobacco: Never Used  Substance Use Topics  . Alcohol use: No  . Drug use: No     Allergies   Patient has no known allergies.   Review of Systems Review of Systems  Constitutional: Negative for chills and fever.  Gastrointestinal: Negative for blood in stool,  nausea, rectal pain and vomiting.  Skin: Positive for color change. Negative for wound.     Physical Exam Updated Vital Signs BP (!) 111/94 (BP Location: Right Arm)   Pulse (!) 104   Temp 99.8 F (37.7 C) (Oral)   Resp 17   LMP  (LMP Unknown)   SpO2 100%   Physical Exam Vitals signs and nursing note reviewed.  Constitutional:      General: She is not in acute distress.    Appearance: Normal appearance. She is well-developed and normal weight. She is not ill-appearing or diaphoretic.  HENT:     Head: Normocephalic and atraumatic.  Eyes:     General:        Right eye: No discharge.        Left eye: No discharge.  Pulmonary:     Effort: Pulmonary effort is normal. No respiratory distress.  Abdominal:     General: Abdomen is flat. Bowel sounds are normal. There is no distension.     Palpations: Abdomen is soft. There is no mass.     Tenderness: There is no abdominal tenderness. There is no guarding.   Genitourinary:    Comments: Chaperone present during exam. There is an area of erythema and fluctuance just above into the left of the gluteal cleft, approximately 2 to 3 cm with small amount of surrounding erythema and induration.  No expressible drainage.  There is no tenderness tracking towards the anus Skin:    General: Skin is warm and dry.  Neurological:     Mental Status: She is alert and oriented to person, place, and time.     Coordination: Coordination normal.  Psychiatric:        Mood and Affect: Mood normal.        Behavior: Behavior normal.      ED Treatments / Results  Labs (all labs ordered are listed, but only abnormal results are displayed) Labs Reviewed - No data to display  EKG None  Radiology No results found.  Procedures .Marland KitchenIncision and Drainage  Date/Time: 05/02/2019 11:56 AM Performed by: Jacqlyn Larsen, PA-C Authorized by: Jacqlyn Larsen, PA-C   Consent:    Consent obtained:  Verbal   Consent given by:  Patient   Risks discussed:  Incomplete drainage, pain, damage to other organs, bleeding and infection   Alternatives discussed:  No treatment Location:    Type:  Pilonidal cyst   Size:  3 cm   Location:  Anogenital   Anogenital location:  Pilonidal Pre-procedure details:    Skin preparation:  Chloraprep Anesthesia (see MAR for exact dosages):    Anesthesia method:  Local infiltration   Local anesthetic:  Lidocaine 2% WITH epi Procedure type:    Complexity:  Simple Procedure details:    Incision types:  Single straight   Incision depth:  Dermal   Scalpel blade:  11   Wound management:  Probed and deloculated and irrigated with saline   Drainage:  Bloody and purulent   Drainage amount:  Copious   Wound treatment:  Wound left open   Packing materials:  1/2 in iodoform gauze Post-procedure details:    Patient tolerance of procedure:  Tolerated well, no immediate complications Comments:     Procedure performed by PA student with my  immediate supervision   (including critical care time)  Medications Ordered in ED Medications  lidocaine-EPINEPHrine (XYLOCAINE W/EPI) 2 %-1:200000 (PF) injection 10 mL (has no administration in time range)     Initial Impression /  Assessment and Plan / ED Course  I have reviewed the triage vital signs and the nursing notes.  Pertinent labs & imaging results that were available during my care of the patient were reviewed by me and considered in my medical decision making (see chart for details).  Patient with pilonidal abscess noted on exam, no systemic symptoms, no significant surrounding cellulitis.  No prior history of the same.  Bedside incision and drainage performed with copious amounts of bloody purulent drainage.  Due to size and location packing was placed.  Given surrounding induration will place patient on doxycycline for antibiotic coverage.  Packing removal and wound check in 2 days if not improving, otherwise discussed that patient could remove packing at home.  Warm soaks encouraged.  Motrin and Tylenol for pain.  Discharged home in good condition.  Final Clinical Impressions(s) / ED Diagnoses   Final diagnoses:  Pilonidal abscess    ED Discharge Orders         Ordered    doxycycline (VIBRAMYCIN) 100 MG capsule  2 times daily     05/02/19 1155           Dartha Lodge, [place] 05/02/19 1225    Alvira Monday, MD 05/02/19 2120

## 2019-05-02 NOTE — ED Triage Notes (Signed)
She has a small area of erythema and edema at upper left post buttock area (near pilonidal area). She first felt discomfort in this area Tuesday.

## 2019-05-02 NOTE — Discharge Instructions (Signed)
Use ibuprofen and Tylenol as needed for pain.  Take antibiotics as directed.  You can use warm soaks 1-2 times daily to help increase drainage and healing.  Packing will need to be removed in 2 days if area seems to be healing well and you are not having significant increases in pain, redness or drainage it is okay to take this packing out yourself at home I recommend doing this after soaking in water.  If you are having any increased pain, redness, drainage, fevers or do not feel comfortable taking out the packing please return to the ED or with your PCP in 2 days for a wound check.

## 2019-05-02 NOTE — ED Notes (Signed)
The P.A. is with pt. As I write this.

## 2020-02-12 ENCOUNTER — Emergency Department
Admission: EM | Admit: 2020-02-12 | Discharge: 2020-02-12 | Disposition: A | Discharge status: Home / Self Care | Payer: Medicaid Other | Attending: Emergency Medicine | Admitting: Emergency Medicine

## 2020-02-12 ENCOUNTER — Other Ambulatory Visit: Payer: Self-pay

## 2020-02-12 ENCOUNTER — Emergency Department: Payer: Medicaid Other

## 2020-02-12 DIAGNOSIS — R197 Diarrhea, unspecified: Secondary | ICD-10-CM | POA: Diagnosis not present

## 2020-02-12 DIAGNOSIS — R109 Unspecified abdominal pain: Secondary | ICD-10-CM | POA: Diagnosis not present

## 2020-02-12 DIAGNOSIS — R112 Nausea with vomiting, unspecified: Secondary | ICD-10-CM | POA: Insufficient documentation

## 2020-02-12 LAB — URINALYSIS, COMPLETE (UACMP) WITH MICROSCOPIC
Bacteria, UA: NONE SEEN
Bilirubin Urine: NEGATIVE
Glucose, UA: NEGATIVE mg/dL
Hgb urine dipstick: NEGATIVE
Ketones, ur: 80 mg/dL — AB
Nitrite: NEGATIVE
Protein, ur: NEGATIVE mg/dL
Specific Gravity, Urine: 1.027 (ref 1.005–1.030)
WBC, UA: >50 WBC/hpf — ABNORMAL HIGH (ref 0–5)
pH: 5 (ref 5.0–8.0)

## 2020-02-12 LAB — LIPASE, BLOOD: Lipase: 26 U/L (ref 11–51)

## 2020-02-12 LAB — COMPREHENSIVE METABOLIC PANEL
ALT: 14 U/L (ref 0–44)
AST: 17 U/L (ref 15–41)
Albumin: 4.3 g/dL (ref 3.5–5.0)
Alkaline Phosphatase: 74 U/L (ref 38–126)
Anion gap: 10 (ref 5–15)
BUN: 15 mg/dL (ref 6–20)
CO2: 25 mmol/L (ref 22–32)
Calcium: 9.4 mg/dL (ref 8.9–10.3)
Chloride: 103 mmol/L (ref 98–111)
Creatinine, Ser: 0.69 mg/dL (ref 0.44–1.00)
GFR calc Af Amer: >60 mL/min (ref >60)
GFR calc non Af Amer: >60 mL/min (ref >60)
Glucose, Bld: 108 mg/dL — ABNORMAL HIGH (ref 70–99)
Potassium: 3.7 mmol/L (ref 3.5–5.1)
Sodium: 138 mmol/L (ref 135–145)
Total Bilirubin: 0.6 mg/dL (ref 0.3–1.2)
Total Protein: 7.7 g/dL (ref 6.5–8.1)

## 2020-02-12 LAB — CBC
HCT: 33.2 % — ABNORMAL LOW (ref 36.0–46.0)
Hemoglobin: 11.3 g/dL — ABNORMAL LOW (ref 12.0–15.0)
MCH: 28 pg (ref 26.0–34.0)
MCHC: 34 g/dL (ref 30.0–36.0)
MCV: 82.4 fL (ref 80.0–100.0)
Platelets: 407 10*3/uL — ABNORMAL HIGH (ref 150–400)
RBC: 4.03 MIL/uL (ref 3.87–5.11)
RDW: 13.4 % (ref 11.5–15.5)
WBC: 11.1 10*3/uL — ABNORMAL HIGH (ref 4.0–10.5)
nRBC: 0 % (ref 0.0–0.2)

## 2020-02-12 LAB — POCT PREGNANCY, URINE: Preg Test, Ur: NEGATIVE

## 2020-02-12 MED ORDER — SODIUM CHLORIDE 0.9 % IV BOLUS
1000.0000 mL | Freq: Once | INTRAVENOUS | Status: AC
  Administered 2020-02-12: 1000 mL via INTRAVENOUS

## 2020-02-12 MED ORDER — HYOSCYAMINE SULFATE 0.125 MG PO TBDP: 0.1250 mg | ORAL_TABLET | ORAL | 0 refills | Status: AC | PRN

## 2020-02-12 MED ORDER — HYOSCYAMINE SULFATE 0.125 MG PO TBDP
0.2500 mg | ORAL_TABLET | Freq: Once | ORAL | Status: AC
  Administered 2020-02-12: 0.25 mg via ORAL
  Filled 2020-02-12: qty 2

## 2020-02-12 NOTE — ED Provider Notes (Signed)
[facility] Emergency Department Provider Note ____________________________________________   First MD Initiated Contact with Patient 02/12/20 1745     (approximate)  I have reviewed the triage vital signs and the nursing notes.   HISTORY  Chief Complaint Emesis and Fever  HPI Paula Strickland is a 24 y.o. female presenting to the emergency department for treatment and evaluation of nausea, vomiting, diarrhea, and abdominal cramping.  Symptoms started 2 days ago after she ate something at her aunt's house.  She has vomited twice and had 4-5 episodes of diarrhea.  No fever.  No others with similar symptoms.  She has been taking Tylenol and drinking Pedialyte.  She has been able to tolerate bland foods today but continues to have the abdominal cramping and nausea.         Past Medical History:  Diagnosis Date  . Normal delivery   . Seizures (HCC) 20-57 years old    Patient Active Problem List   Diagnosis Date Noted  . Labor and delivery, indication for care 03/01/2016  . First trimester screening 08/24/2015    Past Surgical History:  Procedure Laterality Date  . THROAT SURGERY  24 years old   pt unsure of procedure that was performed    Prior to Admission medications   Medication Sig Start Date End Date Taking? Authorizing Provider  doxycycline (VIBRAMYCIN) 100 MG capsule Take 1 capsule (100 mg total) by mouth 2 (two) times daily. One po bid x 7 days 05/02/19   Dartha Lodge, PA-C  hyoscyamine (ANASPAZ) 0.125 MG TBDP disintergrating tablet Place 1 tablet (0.125 mg total) under the tongue every 4 (four) hours as needed for up to 3 days for cramping. 02/12/20 02/15/20  Dionne Bucy, MD  ibuprofen (ADVIL,MOTRIN) 200 MG tablet Take 800 mg by mouth every 6 (six) hours as needed (migraine).    [provider]  medroxyPROGESTERone (DEPO-PROVERA) 150 MG/ML injection Inject 1 mL (150 mg total) into the muscle once. 03/04/16 06/29/16  Sharee Pimple, CNM    prochlorperazine (COMPAZINE) 10 MG tablet Take 1 tablet (10 mg total) by mouth 2 (two) times daily as needed for nausea or vomiting (Nausea ). 06/29/16   Pisciotta, Joni Reining, PA-C  silver sulfADIAZINE (SILVADENE) 1 % cream Apply 1 application topically daily. 03/28/19   Linwood Dibbles, MD    Allergies Patient has no known allergies.  Family History  Problem Relation Age of Onset  . Migraines Mother   . Hypertension Mother     Social History Social History   Tobacco Use  . Smoking status: Never Smoker  . Smokeless tobacco: Never Used  Substance Use Topics  . Alcohol use: No  . Drug use: No    Review of Systems  Constitutional: No fever/chills Eyes: No visual changes. ENT: No sore throat. Cardiovascular: Denies chest pain. Respiratory: Denies shortness of breath. Gastrointestinal: Positive for abdominal pain.  Positive for nausea and vomiting.  Positive for diarrhea.   Genitourinary: Negative for dysuria. Musculoskeletal: Negative for back pain. Skin: Negative for rash. Neurological: Negative for headaches, focal weakness or numbness. ____________________________________________   PHYSICAL EXAM:  VITAL SIGNS: ED Triage Vitals [02/12/20 1630]  Enc Vitals Group     BP 136/73     Pulse Rate (!) 102     Resp 18     Temp 98 F (36.7 C)     Temp Source Oral     SpO2 99 %     Weight 190 lb (86.2 kg)  Height 5\' 3"  (1.6 m)     Head Circumference      Peak Flow      Pain Score 7     Pain Loc      Pain Edu?      Excl. in GC?     Constitutional: Alert and oriented. Well appearing and in no acute distress. Eyes: Conjunctivae are normal. PERRL. EOMI. Head: Atraumatic. Nose: No congestion/rhinnorhea. Mouth/Throat: Mucous membranes are moist.  Oropharynx non-erythematous. Neck: No stridor.   Hematological/Lymphatic/Immunilogical: No cervical lymphadenopathy. Cardiovascular: Normal rate, regular rhythm. Grossly normal heart sounds.  Good peripheral  circulation. Respiratory: Normal respiratory effort.  No retractions. Lungs CTAB. Gastrointestinal: Soft.  Right upper quadrant tenderness on exam.  No distention. No abdominal bruits. No CVA tenderness. Genitourinary:  Musculoskeletal: No lower extremity tenderness nor edema.  No joint effusions. Neurologic:  Normal speech and language. No gross focal neurologic deficits are appreciated. No gait instability. Skin:  Skin is warm, dry and intact. No rash noted. Psychiatric: Mood and affect are normal. Speech and behavior are normal.  ____________________________________________   LABS (all labs ordered are listed, but only abnormal results are displayed)  Labs Reviewed  COMPREHENSIVE METABOLIC PANEL - Abnormal; Notable for the following components:      Result Value   Glucose, Bld 108 (*)    All other components within normal limits  CBC - Abnormal; Notable for the following components:   WBC 11.1 (*)    Hemoglobin 11.3 (*)    HCT 33.2 (*)    Platelets 407 (*)    All other components within normal limits  URINALYSIS, COMPLETE (UACMP) WITH MICROSCOPIC - Abnormal; Notable for the following components:   Color, Urine YELLOW (*)    APPearance CLOUDY (*)    Ketones, ur 80 (*)    Leukocytes,Ua LARGE (*)    WBC, UA >50 (*)    All other components within normal limits  URINE CULTURE  LIPASE, BLOOD  POC URINE PREG, ED  POCT PREGNANCY, URINE   ____________________________________________  EKG  Not indicated ____________________________________________  RADIOLOGY  ED MD interpretation:    Ultrasound of the right upper quadrant is negative for acute findings per radiology.  I, , personally viewed and evaluated these images (plain radiographs) as part of my medical decision making, as well as reviewing the written report by the radiologist.  Official radiology report(s): [place] Abdomen Limited RUQ  Result Date: 02/12/2020 CLINICAL DATA:  Right upper quadrant abdominal  pain for 2 days EXAM: ULTRASOUND ABDOMEN LIMITED RIGHT UPPER QUADRANT COMPARISON:  None. FINDINGS: Gallbladder: No gallstones or wall thickening visualized. No sonographic Murphy sign noted by sonographer. Common bile duct: Diameter: 4 mm Liver: No focal lesion identified. Increased parenchymal echogenicity. Portal vein is patent on color Doppler imaging with normal direction of blood flow towards the liver. Other: None. IMPRESSION: 1. No acute ultrasound findings of the right upper quadrant to explain pain. 2.  Hepatic steatosis. Electronically Signed   By: 04/14/2020 M.D.   On: 02/12/2020 18:38    ____________________________________________   PROCEDURES  Procedure(s) performed (including Critical Care):  Procedures  ____________________________________________   INITIAL IMPRESSION / ASSESSMENT AND PLAN     24 year old female presenting to the emergency department for treatment and evaluation of abdominal pain, nausea, vomiting, and diarrhea.  See HPI for further details.  While awaiting ER room assignment, labs were drawn.  Plan will be to review the results and get an ultrasound of the right upper quadrant.  DIFFERENTIAL DIAGNOSIS  Acute cholecystitis, gastroenteritis, diverticulitis, diverticulosis,  ED COURSE  Labs are overall reassuring.  Pregnancy test is negative, lipase is normal at 26, CMP is unremarkable, and CBC shows a very mild leukocytosis of 11.1.  Urinalysis shows large leukocytes and greater than 50 white blood cells with 80 ketones.  She denies dysuria or other urinary symptoms.  Levsin and fluids have been ordered.  Patient improved after fluids and medications.  She will be discharged home with prescription for Levsin.  She is to follow-up with her primary care provider or return to the emergency department for symptoms of change or worsen or for new symptoms of concern. ____________________________________________   FINAL CLINICAL IMPRESSION(S) / ED  DIAGNOSES  Final diagnoses:  Abdominal pain     ED Discharge Orders         Ordered    hyoscyamine (ANASPAZ) 0.125 MG TBDP disintergrating tablet  Every 4 hours PRN     Discontinue  Reprint     02/12/20 1918           Paula Strickland was evaluated in Emergency Department on 02/12/2020 for the symptoms described in the history of present illness. She was evaluated in the context of the global COVID-19 pandemic, which necessitated consideration that the patient might be at risk for infection with the SARS-CoV-2 virus that causes COVID-19. Institutional protocols and algorithms that pertain to the evaluation of patients at risk for COVID-19 are in a state of rapid change based on information released by regulatory bodies including the CDC and federal and state organizations. These policies and algorithms were followed during the patient's care in the ED.   Note:  This document was prepared using Dragon voice recognition software and may include unintentional dictation errors.   Chinita Pester, FNP 02/12/20 2022    Dionne Bucy, MD 02/12/20 2200

## 2020-02-12 NOTE — ED Notes (Signed)
US at bedside

## 2020-02-12 NOTE — Discharge Instructions (Signed)
You can take the hyoscyamine as needed for abdominal cramping.  We have sent a culture from your urine to see if there is a true infection; if there is we will call you and can call in an antibiotic.    Return to the ER for new, worsening, or persistent severe abdominal pain, vomiting, fever, pain when urinating, or any other new or worsening symptoms that concern you.

## 2020-02-12 NOTE — ED Triage Notes (Signed)
Pt comes POV from home with emesis and fever since Thursday. Pt states that she ate some bad mac and cheese Thursday night and since then has had cramping and emesis and diarrhea. Pt does not have fever today and has not vomited since yesterday.

## 2020-02-14 LAB — URINE CULTURE: Culture: 5000 — AB

## 2020-02-19 ENCOUNTER — Encounter: Payer: Self-pay | Admitting: Emergency Medicine

## 2020-02-19 ENCOUNTER — Other Ambulatory Visit: Payer: Self-pay

## 2020-02-19 ENCOUNTER — Emergency Department
Admission: EM | Admit: 2020-02-19 | Discharge: 2020-02-19 | Disposition: A | Discharge status: Home / Self Care | Payer: Medicaid Other | Attending: Emergency Medicine | Admitting: Emergency Medicine

## 2020-02-19 DIAGNOSIS — N72 Inflammatory disease of cervix uteri: Secondary | ICD-10-CM | POA: Diagnosis not present

## 2020-02-19 DIAGNOSIS — L292 Pruritus vulvae: Secondary | ICD-10-CM | POA: Diagnosis present

## 2020-02-19 DIAGNOSIS — N390 Urinary tract infection, site not specified: Secondary | ICD-10-CM | POA: Diagnosis not present

## 2020-02-19 LAB — WET PREP, GENITAL
Clue Cells Wet Prep HPF POC: NONE SEEN
Sperm: NONE SEEN
Trich, Wet Prep: NONE SEEN
Yeast Wet Prep HPF POC: NONE SEEN

## 2020-02-19 LAB — POCT PREGNANCY, URINE: Preg Test, Ur: NEGATIVE

## 2020-02-19 LAB — URINALYSIS, COMPLETE (UACMP) WITH MICROSCOPIC
Bacteria, UA: NONE SEEN
Bilirubin Urine: NEGATIVE
Glucose, UA: NEGATIVE mg/dL
Hgb urine dipstick: NEGATIVE
Ketones, ur: NEGATIVE mg/dL
Nitrite: NEGATIVE
Protein, ur: 30 mg/dL — AB
Specific Gravity, Urine: 1.028 (ref 1.005–1.030)
pH: 6 (ref 5.0–8.0)

## 2020-02-19 LAB — CHLAMYDIA/NGC RT PCR (ARMC ONLY)
Chlamydia Tr: NOT DETECTED
N gonorrhoeae: NOT DETECTED

## 2020-02-19 MED ORDER — TRAMADOL HCL 50 MG PO TABS: 50.0000 mg | ORAL_TABLET | Freq: Four times a day (QID) | ORAL | 0 refills | Status: DC | PRN

## 2020-02-19 MED ORDER — CEFTRIAXONE SODIUM 1 G IJ SOLR
1.0000 g | Freq: Once | INTRAMUSCULAR | Status: AC
  Administered 2020-02-19: 1 g via INTRAMUSCULAR
  Filled 2020-02-19 (×2): qty 10

## 2020-02-19 MED ORDER — DOXYCYCLINE HYCLATE 100 MG PO CAPS
100.0000 mg | ORAL_CAPSULE | Freq: Two times a day (BID) | ORAL | 0 refills | Status: DC
Start: 2020-02-19 — End: 2021-02-08

## 2020-02-19 MED ORDER — LIDOCAINE HCL (PF) 1 % IJ SOLN
5.0000 mL | Freq: Once | INTRAMUSCULAR | Status: AC
  Administered 2020-02-19: 5 mL
  Filled 2020-02-19: qty 5

## 2020-02-19 NOTE — Discharge Instructions (Signed)
Follow-up with the [facility] department if any continued problems or concerns.  You may also follow-up with your primary care provider or gynecologist if you have one.  Today you were given an injection that will cover gonorrhea as your cervix was very irritated as we discussed.  The gonorrhea and Chlamydia test should result later this afternoon and you will be able to see these results in my chart.  A prescription for antibiotics was sent to your pharmacy along with pain medication.  Do not drive or operate machinery while taking the tramadol as it could cause drowsiness and increase your risk for injury.  The viral culture results also should be seen in my chart.

## 2020-02-19 NOTE — ED Triage Notes (Signed)
Presents with some urinary freq and pain for the past few days   States she is having some vaginal burning    States she thought she scratched herself  To cause the irritation   But the next day she noticed "some bumps " to her private area

## 2020-02-19 NOTE — ED Provider Notes (Signed)
[facility] Emergency Department Provider Note   ____________________________________________   First MD Initiated Contact with Patient 02/19/20 1315     (approximate)  I have reviewed the triage vital signs and the nursing notes.   HISTORY  Chief Complaint Vaginal Itching and Urinary Tract Infection    HPI Paula Strickland is a 24 y.o. female presents to the ED with complaint of some urinary frequency and pain for the last week.  Patient states she also has some vaginal burning and initially thought she had "scratched herself".  Patient used a suppository today thinking that she had a vaginal infection.  She also noticed "some bumps" that are painful.  Currently she rates her pain as 3 out of 10.       Past Medical History:  Diagnosis Date   Normal delivery    Seizures (HCC) 25-63 years old    Patient Active Problem List   Diagnosis Date Noted   Labor and delivery, indication for care 03/01/2016   First trimester screening 08/24/2015    Past Surgical History:  Procedure Laterality Date   THROAT SURGERY  24 years old   pt unsure of procedure that was performed    Prior to Admission medications   Medication Sig Start Date End Date Taking? Authorizing Provider  doxycycline (VIBRAMYCIN) 100 MG capsule Take 1 capsule (100 mg total) by mouth 2 (two) times daily. 02/19/20   Tommi Rumps, PA-C  hyoscyamine (ANASPAZ) 0.125 MG TBDP disintergrating tablet Place 1 tablet (0.125 mg total) under the tongue every 4 (four) hours as needed for up to 3 days for cramping. 02/12/20 02/15/20  Dionne Bucy, MD  medroxyPROGESTERone (DEPO-PROVERA) 150 MG/ML injection Inject 1 mL (150 mg total) into the muscle once. 03/04/16 06/29/16  Sharee Pimple, CNM  traMADol (ULTRAM) 50 MG tablet Take 1 tablet (50 mg total) by mouth every 6 (six) hours as needed for moderate pain. 02/19/20   Tommi Rumps, PA-C    Allergies Patient has no known allergies.  Family  History  Problem Relation Age of Onset   Migraines Mother    Hypertension Mother     Social History Social History   Tobacco Use   Smoking status: Never Smoker   Smokeless tobacco: Never Used  Substance Use Topics   Alcohol use: No   Drug use: No    Review of Systems Constitutional: No fever/chills Eyes: No visual changes. Cardiovascular: Denies chest pain. Respiratory: Denies shortness of breath. Gastrointestinal: No abdominal pain.  No nausea, no vomiting.  Genitourinary: Positive for dysuria, positive for vaginal burning and questionable discharge. Musculoskeletal: Negative for back pain. Skin: Negative for rash. Neurological: Negative for headaches, focal weakness or numbness. ____________________________________________   PHYSICAL EXAM:  VITAL SIGNS: ED Triage Vitals  Enc Vitals Group     BP 02/19/20 1309 126/72     Pulse Rate 02/19/20 1309 (!) 107     Resp 02/19/20 1309 16     Temp 02/19/20 1309 98.2 F (36.8 C)     Temp Source 02/19/20 1309 Oral     SpO2 02/19/20 1309 97 %     Weight 02/19/20 1307 190 lb (86.2 kg)     Height 02/19/20 1307 5\' 3"  (1.6 m)     Head Circumference --      Peak Flow --      Pain Score 02/19/20 1307 3     Pain Loc --      Pain Edu? --  Excl. in GC? --     Constitutional: Alert and oriented. Well appearing and in no acute distress. Eyes: Conjunctivae are normal.  Head: Atraumatic. Neck: No stridor.   Cardiovascular: Normal rate, regular rhythm. Grossly normal heart sounds.  Good peripheral circulation. Respiratory: Normal respiratory effort.  No retractions. Lungs CTAB. Gastrointestinal: Soft and nontender. No distention.  Genitourinary: External genitalia there is a single small ulcerative lesion noted at the 6 o'clock position which is tender to light palpation.  Viral swab was done to this area.  On speculum exam the cervix is moderately irritated, friable and yellow exudate is present.  No adnexal masses or  tenderness noted.  No cervical motion tenderness. Musculoskeletal: Moves upper and lower extremities no difficulty and normal gait was noted. Neurologic:  Normal speech and language. No gross focal neurologic deficits are appreciated. No gait instability. Skin:  Skin is warm, dry and intact. No rash noted. Psychiatric: Mood and affect are normal. Speech and behavior are normal.  ____________________________________________   LABS (all labs ordered are listed, but only abnormal results are displayed)  Labs Reviewed  WET PREP, GENITAL - Abnormal; Notable for the following components:      Result Value   WBC, Wet Prep HPF POC MANY (*)    All other components within normal limits  URINALYSIS, COMPLETE (UACMP) WITH MICROSCOPIC - Abnormal; Notable for the following components:   Color, Urine YELLOW (*)    APPearance CLOUDY (*)    Protein, ur 30 (*)    Leukocytes,Ua SMALL (*)    All other components within normal limits  CHLAMYDIA/NGC RT PCR ([facility] ONLY)  HSV CULTURE AND TYPING  POC URINE PREG, ED  POCT PREGNANCY, URINE    PROCEDURES  Procedure(s) performed (including Critical Care):  Procedures   ____________________________________________   INITIAL IMPRESSION / ASSESSMENT AND PLAN / ED COURSE  As part of my medical decision making, I reviewed the following data within the electronic MEDICAL RECORD NUMBER Notes from prior ED visits and [place] Controlled Substance Database  24 year old female presents to the ED with complaint of urinary frequency along with having some vaginal burning.  She states that she has also had some vaginal discharge and now has noticed some "bumps" that are painful.  Patient denies any previous vaginal issues.  On physical exam there was 1 single unroofed papule that was tender and a viral swab was done.  Also on vaginal exam the cervix was friable and moderate amount of exudate was present.  Wet prep showed many WBCs.  With given her symptoms and the  appearance of her cervix we will treat for STDs.  Patient was given Rocephin 1 g IM and a prescription for doxycycline was sent to her pharmacy.  She is aware that she can see the results of her test on my chart along with the viral swab.  She is to follow-up with [facility] department if any continued test are needed.  ____________________________________________   FINAL CLINICAL IMPRESSION(S) / ED DIAGNOSES  Final diagnoses:  Acute cervicitis     ED Discharge Orders         Ordered    doxycycline (VIBRAMYCIN) 100 MG capsule  2 times daily     Discontinue  Reprint     02/19/20 1547    traMADol (ULTRAM) 50 MG tablet  Every 6 hours PRN     Discontinue  Reprint     02/19/20 1547           Note:  This  document was prepared using Conservation officer, historic buildings and may include unintentional dictation errors.    Tommi Rumps, PA-C 02/19/20 1611    Arnaldo Natal, MD 02/20/20 [phone]

## 2020-02-22 LAB — HSV CULTURE AND TYPING

## 2020-02-23 ENCOUNTER — Telehealth: Payer: Self-pay | Admitting: Emergency Medicine

## 2020-02-23 MED ORDER — VALACYCLOVIR HCL 1 G PO TABS
1000.0000 mg | ORAL_TABLET | Freq: Two times a day (BID) | ORAL | 0 refills | Status: AC
Start: 2020-02-23 — End: 2020-03-01

## 2020-02-23 NOTE — Telephone Encounter (Signed)
Patient with herpes simplex virus infection, called in Valtrex

## 2020-02-23 NOTE — Telephone Encounter (Signed)
Called patient to assure she is aware of hsv result.  She did see it in [place].  I explained that dr Mayford Knife has sent in rx for valtrex to her pharmacy.  I told her she should follow up with her doctor to see if she will continue any medications.  She says she does not have a pcp yet, but she is on a wait list.  She does have medicaid. she said she has not been able to go back to work. I told her she can go to [facility] for a follow up visit, and ask for additional work note there.

## 2020-05-22 DIAGNOSIS — F1220 Cannabis dependence, uncomplicated: Secondary | ICD-10-CM | POA: Insufficient documentation

## 2020-05-22 DIAGNOSIS — F332 Major depressive disorder, recurrent severe without psychotic features: Secondary | ICD-10-CM | POA: Insufficient documentation

## 2020-05-22 DIAGNOSIS — F1010 Alcohol abuse, uncomplicated: Secondary | ICD-10-CM | POA: Insufficient documentation

## 2020-05-22 DIAGNOSIS — F1420 Cocaine dependence, uncomplicated: Secondary | ICD-10-CM | POA: Insufficient documentation

## 2021-02-08 ENCOUNTER — Emergency Department: Payer: Medicaid Other

## 2021-02-08 ENCOUNTER — Encounter: Payer: Self-pay | Admitting: Emergency Medicine

## 2021-02-08 ENCOUNTER — Other Ambulatory Visit: Payer: Self-pay

## 2021-02-08 ENCOUNTER — Emergency Department
Admission: EM | Admit: 2021-02-08 | Discharge: 2021-02-08 | Disposition: A | Discharge status: Home / Self Care | Payer: Medicaid Other | Attending: Emergency Medicine | Admitting: Emergency Medicine

## 2021-02-08 DIAGNOSIS — K6289 Other specified diseases of anus and rectum: Secondary | ICD-10-CM | POA: Insufficient documentation

## 2021-02-08 DIAGNOSIS — R1030 Lower abdominal pain, unspecified: Secondary | ICD-10-CM | POA: Diagnosis not present

## 2021-02-08 LAB — URINALYSIS, COMPLETE (UACMP) WITH MICROSCOPIC
Bilirubin Urine: NEGATIVE
Glucose, UA: NEGATIVE mg/dL
Hgb urine dipstick: NEGATIVE
Ketones, ur: NEGATIVE mg/dL
Nitrite: NEGATIVE
Protein, ur: NEGATIVE mg/dL
Specific Gravity, Urine: 1.02 (ref 1.005–1.030)
pH: 6 (ref 5.0–8.0)

## 2021-02-08 LAB — COMPREHENSIVE METABOLIC PANEL
ALT: 9 U/L (ref 0–44)
AST: 14 U/L — ABNORMAL LOW (ref 15–41)
Albumin: 3.9 g/dL (ref 3.5–5.0)
Alkaline Phosphatase: 80 U/L (ref 38–126)
Anion gap: 4 — ABNORMAL LOW (ref 5–15)
BUN: 13 mg/dL (ref 6–20)
CO2: 27 mmol/L (ref 22–32)
Calcium: 9.1 mg/dL (ref 8.9–10.3)
Chloride: 104 mmol/L (ref 98–111)
Creatinine, Ser: 0.68 mg/dL (ref 0.44–1.00)
GFR, Estimated: >60 mL/min (ref >60)
Glucose, Bld: 120 mg/dL — ABNORMAL HIGH (ref 70–99)
Potassium: 3.7 mmol/L (ref 3.5–5.1)
Sodium: 135 mmol/L (ref 135–145)
Total Bilirubin: 0.6 mg/dL (ref 0.3–1.2)
Total Protein: 7.5 g/dL (ref 6.5–8.1)

## 2021-02-08 LAB — CBC
HCT: 34.7 % — ABNORMAL LOW (ref 36.0–46.0)
Hemoglobin: 11.3 g/dL — ABNORMAL LOW (ref 12.0–15.0)
MCH: 28.5 pg (ref 26.0–34.0)
MCHC: 32.6 g/dL (ref 30.0–36.0)
MCV: 87.4 fL (ref 80.0–100.0)
Platelets: 303 10*3/uL (ref 150–400)
RBC: 3.97 MIL/uL (ref 3.87–5.11)
RDW: 13 % (ref 11.5–15.5)
WBC: 10.1 10*3/uL (ref 4.0–10.5)
nRBC: 0 % (ref 0.0–0.2)

## 2021-02-08 LAB — LIPASE, BLOOD: Lipase: 29 U/L (ref 11–51)

## 2021-02-08 LAB — POC URINE PREG, ED: Preg Test, Ur: NEGATIVE

## 2021-02-08 MED ORDER — ONDANSETRON HCL 4 MG/2ML IJ SOLN
4.0000 mg | Freq: Once | INTRAMUSCULAR | Status: AC
  Administered 2021-02-08: 4 mg via INTRAVENOUS
  Filled 2021-02-08: qty 2

## 2021-02-08 MED ORDER — DICYCLOMINE HCL 10 MG PO CAPS: 10.0000 mg | ORAL_CAPSULE | Freq: Three times a day (TID) | ORAL | 0 refills | Status: AC | PRN

## 2021-02-08 MED ORDER — IOHEXOL 300 MG/ML  SOLN
100.0000 mL | Freq: Once | INTRAMUSCULAR | Status: AC | PRN
  Administered 2021-02-08: 100 mL via INTRAVENOUS
  Filled 2021-02-08: qty 100

## 2021-02-08 MED ORDER — CIPROFLOXACIN HCL 500 MG PO TABS
500.0000 mg | ORAL_TABLET | Freq: Two times a day (BID) | ORAL | 0 refills | Status: AC
Start: 2021-02-08 — End: 2021-02-15

## 2021-02-08 MED ORDER — METRONIDAZOLE 500 MG PO TABS
500.0000 mg | ORAL_TABLET | Freq: Three times a day (TID) | ORAL | 0 refills | Status: AC
Start: 2021-02-08 — End: 2021-02-15

## 2021-02-08 MED ORDER — KETOROLAC TROMETHAMINE 30 MG/ML IJ SOLN
30.0000 mg | Freq: Once | INTRAMUSCULAR | Status: AC
  Administered 2021-02-08: 30 mg via INTRAVENOUS
  Filled 2021-02-08: qty 1

## 2021-02-08 MED ORDER — DICYCLOMINE HCL 10 MG PO CAPS
10.0000 mg | ORAL_CAPSULE | Freq: Once | ORAL | Status: AC
  Administered 2021-02-08: 10 mg via ORAL
  Filled 2021-02-08: qty 1

## 2021-02-08 NOTE — ED Provider Notes (Signed)
[facility] Emergency Department Provider Note  ____________________________________________   Event Date/Time   First MD Initiated Contact with Patient 02/08/21 1317     (approximate)  I have reviewed the triage vital signs and the nursing notes.   HISTORY  Chief Complaint Abdominal Pain    HPI Paula Strickland is a 25 y.o. female here with lower abdominal pain.  The patient states that for the last week, she has had intermittent lower abdominal and perirectal pain.  She has had loose stools, occasionally with mucus in them.  Symptoms have been fairly constant.  She said some intermittent lower abdominal pain and cramping with this.  She did have 1 episode of some blood around the stool, but this has not persisted.  She denies any fevers or chills.  No weight loss.  She does have a family history of Crohn's disease in her father.  No other acute complaints.  No personal history of Crohn's.  She has never seen a GI physician.  The pain seems to come and go randomly, without specific alleviating factors.    Past Medical History:  Diagnosis Date   Normal delivery    Seizures (HCC) 38-81 years old    Patient Active Problem List   Diagnosis Date Noted   Labor and delivery, indication for care 03/01/2016   First trimester screening 08/24/2015    Past Surgical History:  Procedure Laterality Date   THROAT SURGERY  25 years old   pt unsure of procedure that was performed    Prior to Admission medications   Medication Sig Start Date End Date Taking? Authorizing Provider  ciprofloxacin (CIPRO) 500 MG tablet Take 1 tablet (500 mg total) by mouth 2 (two) times daily for 7 days. 02/08/21 02/15/21 Yes Shaune Pollack, MD  dicyclomine (BENTYL) 10 MG capsule Take 1 capsule (10 mg total) by mouth 3 (three) times daily as needed for up to 7 days for spasms. 02/08/21 02/15/21 Yes Shaune Pollack, MD  metroNIDAZOLE (FLAGYL) 500 MG tablet Take 1 tablet (500 mg total) by mouth 3  (three) times daily for 7 days. 02/08/21 02/15/21 Yes Shaune Pollack, MD  hyoscyamine (ANASPAZ) 0.125 MG TBDP disintergrating tablet Place 1 tablet (0.125 mg total) under the tongue every 4 (four) hours as needed for up to 3 days for cramping. 02/12/20 02/15/20  Dionne Bucy, MD  medroxyPROGESTERone (DEPO-PROVERA) 150 MG/ML injection Inject 1 mL (150 mg total) into the muscle once. 03/04/16 06/29/16  Sharee Pimple, CNM    Allergies Patient has no known allergies.  Family History  Problem Relation Age of Onset   Migraines Mother    Hypertension Mother     Social History Social History   Tobacco Use   Smoking status: Never   Smokeless tobacco: Never  Substance Use Topics   Alcohol use: No   Drug use: No    Review of Systems  Review of Systems  Constitutional:  Positive for fatigue. Negative for fever.  HENT:  Negative for congestion and sore throat.   Eyes:  Negative for visual disturbance.  Respiratory:  Negative for cough and shortness of breath.   Cardiovascular:  Negative for chest pain.  Gastrointestinal:  Positive for abdominal pain, diarrhea and nausea. Negative for vomiting.  Genitourinary:  Negative for flank pain.  Musculoskeletal:  Negative for back pain and neck pain.  Skin:  Negative for rash and wound.  Neurological:  Negative for weakness.  All other systems reviewed and are negative.   ____________________________________________  PHYSICAL  EXAM:      VITAL SIGNS: ED Triage Vitals  Enc Vitals Group     BP 02/08/21 1146 (!) 117/49     Pulse Rate 02/08/21 1146 98     Resp 02/08/21 1146 18     Temp 02/08/21 1146 98.2 F (36.8 C)     Temp Source 02/08/21 1146 Oral     SpO2 02/08/21 1146 100 %     Weight 02/08/21 1147 185 lb (83.9 kg)     Height 02/08/21 1147 5\' 3"  (1.6 m)     Head Circumference --      Peak Flow --      Pain Score 02/08/21 1147 8     Pain Loc --      Pain Edu? --      Excl. in GC? --      Physical Exam Vitals and nursing note  reviewed.  Constitutional:      General: She is not in acute distress.    Appearance: She is well-developed.  HENT:     Head: Normocephalic and atraumatic.  Eyes:     Conjunctiva/sclera: Conjunctivae normal.  Cardiovascular:     Rate and Rhythm: Normal rate and regular rhythm.     Heart sounds: Normal heart sounds. No murmur heard.   No friction rub.  Pulmonary:     Effort: Pulmonary effort is normal. No respiratory distress.     Breath sounds: Normal breath sounds. No wheezing or rales.  Abdominal:     General: There is no distension.     Palpations: Abdomen is soft.     Tenderness: There is abdominal tenderness in the suprapubic area. There is no guarding or rebound.  Musculoskeletal:     Cervical back: Neck supple.  Skin:    General: Skin is warm.     Capillary Refill: Capillary refill takes less than 2 seconds.  Neurological:     Mental Status: She is alert and oriented to person, place, and time.     Motor: No abnormal muscle tone.      ____________________________________________   LABS (all labs ordered are listed, but only abnormal results are displayed)  Labs Reviewed  COMPREHENSIVE METABOLIC PANEL - Abnormal; Notable for the following components:      Result Value   Glucose, Bld 120 (*)    AST 14 (*)    Anion gap 4 (*)    All other components within normal limits  CBC - Abnormal; Notable for the following components:   Hemoglobin 11.3 (*)    HCT 34.7 (*)    All other components within normal limits  URINALYSIS, COMPLETE (UACMP) WITH MICROSCOPIC - Abnormal; Notable for the following components:   Color, Urine YELLOW (*)    APPearance HAZY (*)    Leukocytes,Ua LARGE (*)    Bacteria, UA RARE (*)    All other components within normal limits  LIPASE, BLOOD  POC URINE PREG, ED    ____________________________________________  EKG:  ________________________________________  RADIOLOGY All imaging, including plain films, CT scans, and ultrasounds,  independently reviewed by me, and interpretations confirmed via formal radiology reads.  ED MD interpretation:   CT A/P: No acute abnormality, normal appendix  Official radiology report(s): CT ABDOMEN PELVIS W CONTRAST  Result Date: 02/08/2021 CLINICAL DATA:  Abdominal pain, acute, nonlocalized EXAM: CT ABDOMEN AND PELVIS WITH CONTRAST TECHNIQUE: Multidetector CT imaging of the abdomen and pelvis was performed using the standard protocol following bolus administration of intravenous contrast. CONTRAST:  02/10/2021 OMNIPAQUE IOHEXOL 300  MG/ML  SOLN COMPARISON:  Ultrasound 02/12/2020 FINDINGS: Lower chest: No acute abnormality. Hepatobiliary: No focal liver abnormality is seen. No gallstones, gallbladder wall thickening, or biliary dilatation. Pancreas: Unremarkable. No pancreatic ductal dilatation or surrounding inflammatory changes. Spleen: Normal in size without focal abnormality. Adrenals/Urinary Tract: Adrenal glands are unremarkable. Kidneys are normal, without renal calculi, focal lesion, or hydronephrosis. Bladder is unremarkable. Stomach/Bowel: Stomach is within normal limits. Appendix appears normal. No evidence of bowel wall thickening, distention, or inflammatory changes. Vascular/Lymphatic: No significant vascular findings are present. No enlarged abdominal or pelvic lymph nodes. Reproductive: Corpus luteal cyst noted in the left ovary. Other: Trace free fluid in the pelvis, likely physiologic. No abdominal wall hernia. Musculoskeletal: No acute or significant osseous findings. IMPRESSION: No acute abdominopelvic abnormality.  Normal appendix. Electronically Signed   By: Caprice Renshaw   On: 02/08/2021 15:00    ____________________________________________  PROCEDURES   Procedure(s) performed (including Critical Care):  Procedures  ____________________________________________  INITIAL IMPRESSION / MDM / ASSESSMENT AND PLAN / ED COURSE  As part of my medical decision making, I reviewed the  following data within the electronic MEDICAL RECORD NUMBER Nursing notes reviewed and incorporated, Old chart reviewed, Notes from prior ED visits, and [place] Controlled Substance Database       *Cliffie Gingras was evaluated in Emergency Department on 02/08/2021 for the symptoms described in the history of present illness. She was evaluated in the context of the global COVID-19 pandemic, which necessitated consideration that the patient might be at risk for infection with the SARS-CoV-2 virus that causes COVID-19. Institutional protocols and algorithms that pertain to the evaluation of patients at risk for COVID-19 are in a state of rapid change based on information released by regulatory bodies including the CDC and federal and state organizations. These policies and algorithms were followed during the patient's care in the ED.  Some ED evaluations and interventions may be delayed as a result of limited staffing during the pandemic.*     Medical Decision Making: 25 year old female here with lower abdominal pain and loose stool.  Exam is largely unremarkable.  Lab work shows normal white blood cell count.  Mild anemia noted.  CMP unremarkable with normal AST and ALT, bilirubin.  UA does show possible pyuria/UTI.  UPT negative.  CT obtained, reviewed, and showed no evidence of acute normality.  She does have some symptoms suggestive of possible proctitis with tenesmus.  Denies history of anal intercourse.  She also has a family history of Crohn's although CT does not show any secondary signs of this.  Given her diarrhea, tenesmus, and pyuria, will treat for possible proctitis with Cipro and Flagyl.  This will also treat concomitant UTI.  Refer her to GI if symptoms do not resolve.  ____________________________________________  FINAL CLINICAL IMPRESSION(S) / ED DIAGNOSES  Final diagnoses:  Lower abdominal pain     MEDICATIONS GIVEN DURING THIS VISIT:  Medications  iohexol (OMNIPAQUE) 300 MG/ML solution 100  mL (100 mLs Intravenous Contrast Given 02/08/21 1426)  ketorolac (TORADOL) 30 MG/ML injection 30 mg (30 mg Intravenous Given 02/08/21 1531)  ondansetron (ZOFRAN) injection 4 mg (4 mg Intravenous Given 02/08/21 1531)  dicyclomine (BENTYL) capsule 10 mg (10 mg Oral Given 02/08/21 1532)     ED Discharge Orders          Ordered    ciprofloxacin (CIPRO) 500 MG tablet  2 times daily        02/08/21 1559    metroNIDAZOLE (FLAGYL) 500 MG tablet  3  times daily        02/08/21 1559    dicyclomine (BENTYL) 10 MG capsule  3 times daily PRN        02/08/21 1559             Note:  This document was prepared using Dragon voice recognition software and may include unintentional dictation errors.   Shaune PollackIsaacs, Jameshia Hayashida, MD 02/08/21 [phone]

## 2021-02-08 NOTE — ED Notes (Signed)
Pt given cup to provide urine specimen.

## 2021-02-08 NOTE — ED Notes (Signed)
Patient returned from CT

## 2021-02-08 NOTE — ED Triage Notes (Signed)
Pt reports that she is having lower abd pain, with complaints of seeing mucous in her stool and it being painful when she has a BM. She noticed bright red blood in her stool for one episode

## 2021-04-09 ENCOUNTER — Ambulatory Visit: Payer: Medicaid Other | Admitting: Gastroenterology

## 2021-04-09 ENCOUNTER — Other Ambulatory Visit: Payer: Self-pay

## 2021-06-23 ENCOUNTER — Other Ambulatory Visit: Payer: Self-pay

## 2021-06-23 ENCOUNTER — Ambulatory Visit
Admission: EM | Admit: 2021-06-23 | Discharge: 2021-06-23 | Disposition: A | Discharge status: Home / Self Care | Payer: Medicaid Other | Attending: Physician Assistant | Admitting: Physician Assistant

## 2021-06-23 ENCOUNTER — Encounter: Payer: Self-pay | Admitting: Licensed Clinical Social Worker

## 2021-06-23 DIAGNOSIS — R051 Acute cough: Secondary | ICD-10-CM | POA: Diagnosis not present

## 2021-06-23 DIAGNOSIS — J209 Acute bronchitis, unspecified: Secondary | ICD-10-CM | POA: Diagnosis not present

## 2021-06-23 DIAGNOSIS — R0602 Shortness of breath: Secondary | ICD-10-CM | POA: Diagnosis not present

## 2021-06-23 MED ORDER — BENZONATATE 200 MG PO CAPS
200.0000 mg | ORAL_CAPSULE | Freq: Three times a day (TID) | ORAL | 0 refills | Status: AC | PRN
Start: 2021-06-23 — End: ?

## 2021-06-23 MED ORDER — DOXYCYCLINE HYCLATE 100 MG PO CAPS
100.0000 mg | ORAL_CAPSULE | Freq: Two times a day (BID) | ORAL | 0 refills | Status: AC
Start: 2021-06-23 — End: 2021-06-30

## 2021-06-23 MED ORDER — PREDNISONE 20 MG PO TABS: 40.0000 mg | ORAL_TABLET | Freq: Every day | ORAL | 0 refills | Status: AC

## 2021-06-23 MED ORDER — ALBUTEROL SULFATE HFA 108 (90 BASE) MCG/ACT IN AERS: 1.0000 | INHALATION_SPRAY | Freq: Four times a day (QID) | RESPIRATORY_TRACT | 0 refills | Status: AC | PRN

## 2021-06-23 NOTE — Discharge Instructions (Addendum)
-  Concern for developing pneumonia, so I have sent antibiotics to pharmacy.  Additionally I sent prednisone and an inhaler for you.  Make sure to increase rest and fluids.  I have sent a cough medication 2.  If this is not helpful he can continue with the OTC cough medications. - If you are still not feeling better over the next week or if your symptoms worsen you should be seen again.

## 2021-06-23 NOTE — ED Provider Notes (Signed)
[facility]    CSN: [id number] Arrival date & time: 06/23/21  0913      History   Chief Complaint Chief Complaint  Patient presents with   Cough   Fever    HPI Paula Strickland is a 25 y.o. female presenting for 2-week history of productive cough, nasal congestion and feeling feverish.  Patient also reports headaches and feeling fatigued/weak.  She says symptoms have gotten worse and not better.  Now reports that she has some chest tightness and occasionally feels like she cannot take a good deep breath.  She denies any sick contacts or known exposure to influenza or COVID-19.  Has had negative COVID test at home.  Has been taking over-the-counter cough medication.  No other complaints.  HPI  Past Medical History:  Diagnosis Date   Normal delivery    Seizures ([place]) 26-32 years old    Patient Active Problem List   Diagnosis Date Noted   Alcohol use disorder, mild, abuse 05/22/2020   Cannabis use disorder, severe, dependence ([place]) 05/22/2020   Cocaine use disorder, severe, dependence ([place]) 05/22/2020   Severe episode of recurrent major depressive disorder, without psychotic features ([place]) 05/22/2020   Labor and delivery, indication for care 03/01/2016   Maternal anemia in pregnancy, antepartum, third trimester 01/04/2016   First trimester screening 08/24/2015    Past Surgical History:  Procedure Laterality Date   THROAT SURGERY  25 years old   pt unsure of procedure that was performed    OB History     Gravida  1   Para  1   Term  1   Preterm      AB      Living  1      SAB      IAB      Ectopic      Multiple  0   Live Births  1        Obstetric Comments  Pt. Here for IOL.           Home Medications    Prior to Admission medications   Medication Sig Start Date End Date Taking? Authorizing Provider  albuterol (VENTOLIN HFA) 108 (90 Base) MCG/ACT inhaler Inhale 1-2 puffs into the lungs every 6 (six) hours as needed for wheezing or  shortness of breath. 06/23/21  Yes Laurene Footman B, PA-C  benzonatate (TESSALON) 200 MG capsule Take 1 capsule (200 mg total) by mouth 3 (three) times daily as needed for cough. 06/23/21  Yes Laurene Footman B, PA-C  doxycycline (VIBRAMYCIN) 100 MG capsule Take 1 capsule (100 mg total) by mouth 2 (two) times daily for 7 days. 06/23/21 06/30/21 Yes Danton Clap, PA-C  predniSONE (DELTASONE) 20 MG tablet Take 2 tablets (40 mg total) by mouth daily for 5 days. 06/23/21 06/28/21 Yes Laurene Footman B, PA-C  sertraline (ZOLOFT) 50 MG tablet Take 50 mg by mouth every morning. 11/10/20  Yes [provider]  traZODone (DESYREL) 100 MG tablet Take 100 mg by mouth at bedtime as needed. 11/10/20  Yes [provider]  valACYclovir (VALTREX) 500 MG tablet Take by mouth. 11/10/20  Yes [provider]  dicyclomine (BENTYL) 10 MG capsule Take 1 capsule (10 mg total) by mouth 3 (three) times daily as needed for up to 7 days for spasms. 02/08/21 02/15/21  Duffy Bruce, MD  hyoscyamine (ANASPAZ) 0.125 MG TBDP disintergrating tablet Place 1 tablet (0.125 mg total) under the tongue every 4 (four) hours as needed for up  to 3 days for cramping. 02/12/20 02/15/20  Dionne Bucy, MD  medroxyPROGESTERone (DEPO-PROVERA) 150 MG/ML injection Inject 1 mL (150 mg total) into the muscle once. 03/04/16 06/29/16  Sharee Pimple, CNM    Family History Family History  Problem Relation Age of Onset   Migraines Mother    Hypertension Mother     Social History Social History   Tobacco Use   Smoking status: Never   Smokeless tobacco: Never  Substance Use Topics   Alcohol use: No   Drug use: No     Allergies   Patient has no known allergies.   Review of Systems Review of Systems  Constitutional:  Positive for fatigue and fever. Negative for chills and diaphoresis.  HENT:  Positive for congestion and rhinorrhea. Negative for ear pain, sinus pressure, sinus pain and sore throat.   Respiratory:   Positive for cough, chest tightness and shortness of breath.   Cardiovascular:  Negative for chest pain.  Gastrointestinal:  Negative for abdominal pain, nausea and vomiting.  Musculoskeletal:  Negative for arthralgias and myalgias.  Skin:  Negative for rash.  Neurological:  Positive for headaches.  Hematological:  Negative for adenopathy.    Physical Exam Triage Vital Signs ED Triage Vitals  Enc Vitals Group     BP 06/23/21 1006 117/64     Pulse Rate 06/23/21 1006 98     Resp 06/23/21 1006 16     Temp 06/23/21 1006 98.5 F (36.9 C)     Temp Source 06/23/21 1006 Oral     SpO2 06/23/21 1006 98 %     Weight 06/23/21 1004 185 lb (83.9 kg)     Height 06/23/21 1004 5\' 3"  (1.6 m)     Head Circumference --      Peak Flow --      Pain Score 06/23/21 1004 0     Pain Loc --      Pain Edu? --      Excl. in GC? --    No data found.  Updated Vital Signs BP 117/64 (BP Location: Left Arm)   Pulse 98   Temp 98.5 F (36.9 C) (Oral)   Resp 16   Ht 5\' 3"  (1.6 m)   Wt 185 lb (83.9 kg)   LMP 06/21/2021 (Approximate)   SpO2 98%   BMI 32.77 kg/m      Physical Exam Vitals and nursing note reviewed.  Constitutional:      General: She is not in acute distress.    Appearance: Normal appearance. She is ill-appearing. She is not toxic-appearing.  HENT:     Head: Normocephalic and atraumatic.     Nose: Congestion present.     Mouth/Throat:     Mouth: Mucous membranes are moist.     Pharynx: Oropharynx is clear.  Eyes:     General: No scleral icterus.       Right eye: No discharge.        Left eye: No discharge.     Conjunctiva/sclera: Conjunctivae normal.  Cardiovascular:     Rate and Rhythm: Normal rate and regular rhythm.     Heart sounds: Normal heart sounds.  Pulmonary:     Effort: Pulmonary effort is normal. No respiratory distress.     Breath sounds: Rhonchi (scattered rhonchi throughout) present.  Musculoskeletal:     Cervical back: Neck supple.  Skin:    General: Skin  is dry.  Neurological:     General: No focal deficit present.  Mental Status: She is alert. Mental status is at baseline.     Motor: No weakness.     Gait: Gait normal.  Psychiatric:        Mood and Affect: Mood normal.        Behavior: Behavior normal.        Thought Content: Thought content normal.     UC Treatments / Results  Labs (all labs ordered are listed, but only abnormal results are displayed) Labs Reviewed - No data to display  EKG   Radiology No results found.  Procedures Procedures (including critical care time)  Medications Ordered in UC Medications - No data to display  Initial Impression / Assessment and Plan / UC Course  I have reviewed the triage vital signs and the nursing notes.  Pertinent labs & imaging results that were available during my care of the patient were reviewed by me and considered in my medical decision making (see chart for details).  25 year old female presenting for 2-week history of productive cough and congestion.  Also reports chest tightness and occasional shortness of breath and feeling feverish.  Symptoms have gotten worse and not better.  Vitals are normal and stable.  She is ill-appearing but nontoxic.  She does have diffuse rhonchi throughout chest and nasal congestion.  Advised patient symptoms consistent with bronchitis and possibly early pneumonia.  Treating at this time with doxycycline, prednisone and ProAir as well as benzonatate.  Advised increased rest and fluids.  Thoroughly reviewed return and ED precautions and patient provided with a work note.   Final Clinical Impressions(s) / UC Diagnoses   Final diagnoses:  Acute bronchitis, unspecified organism  Acute cough  Shortness of breath     Discharge Instructions      -Concern for developing pneumonia, so I have sent antibiotics to pharmacy.  Additionally I sent prednisone and an inhaler for you.  Make sure to increase rest and fluids.  I have sent a cough  medication 2.  If this is not helpful he can continue with the OTC cough medications. - If you are still not feeling better over the next week or if your symptoms worsen you should be seen again.     ED Prescriptions     Medication Sig Dispense Auth. Provider   doxycycline (VIBRAMYCIN) 100 MG capsule Take 1 capsule (100 mg total) by mouth 2 (two) times daily for 7 days. 14 capsule Laurene Footman B, PA-C   predniSONE (DELTASONE) 20 MG tablet Take 2 tablets (40 mg total) by mouth daily for 5 days. 10 tablet Laurene Footman B, PA-C   albuterol (VENTOLIN HFA) 108 (90 Base) MCG/ACT inhaler Inhale 1-2 puffs into the lungs every 6 (six) hours as needed for wheezing or shortness of breath. 1 g Laurene Footman B, PA-C   benzonatate (TESSALON) 200 MG capsule Take 1 capsule (200 mg total) by mouth 3 (three) times daily as needed for cough. 30 capsule Danton Clap, PA-C      PDMP not reviewed this encounter.   Danton Clap, PA-C 06/23/21 1042

## 2021-06-23 NOTE — ED Triage Notes (Signed)
Pt c/o cough, congestion, fever, weakness, headache sxs x 2 weeks.

## 2021-06-27 ENCOUNTER — Other Ambulatory Visit: Payer: Self-pay

## 2021-06-27 ENCOUNTER — Encounter: Payer: Self-pay | Admitting: Emergency Medicine

## 2021-06-27 ENCOUNTER — Emergency Department
Admission: EM | Admit: 2021-06-27 | Discharge: 2021-06-27 | Disposition: A | Discharge status: Home / Self Care | Payer: Medicaid Other | Attending: Emergency Medicine | Admitting: Emergency Medicine

## 2021-06-27 DIAGNOSIS — Z79899 Other long term (current) drug therapy: Secondary | ICD-10-CM | POA: Insufficient documentation

## 2021-06-27 DIAGNOSIS — Z202 Contact with and (suspected) exposure to infections with a predominantly sexual mode of transmission: Secondary | ICD-10-CM | POA: Diagnosis not present

## 2021-06-27 LAB — POC URINE PREG, ED: Preg Test, Ur: NEGATIVE

## 2021-06-27 LAB — CHLAMYDIA/NGC RT PCR (ARMC ONLY)
Chlamydia Tr: DETECTED — AB
N gonorrhoeae: DETECTED — AB

## 2021-06-27 LAB — HIV ANTIBODY (ROUTINE TESTING W REFLEX): HIV Screen 4th Generation wRfx: NONREACTIVE

## 2021-06-27 MED ORDER — DOXYCYCLINE HYCLATE 100 MG PO TABS
100.0000 mg | ORAL_TABLET | Freq: Once | ORAL | Status: AC
  Administered 2021-06-27: 100 mg via ORAL
  Filled 2021-06-27: qty 1

## 2021-06-27 MED ORDER — DOXYCYCLINE MONOHYDRATE 100 MG PO TABS: 100.0000 mg | ORAL_TABLET | Freq: Two times a day (BID) | ORAL | 0 refills | Status: AC

## 2021-06-27 MED ORDER — CEFTRIAXONE SODIUM 1 G IJ SOLR
500.0000 mg | Freq: Once | INTRAMUSCULAR | Status: AC
  Administered 2021-06-27: 500 mg via INTRAMUSCULAR
  Filled 2021-06-27: qty 10

## 2021-06-27 NOTE — ED Triage Notes (Signed)
Pt comes into the ED via POV c/o need for STD checking.  Pt states she had intercourse with someone recently where they are unsure if they had been exposed to any STD's.  Pt denies any vaginal discharge or new odors.  Pt in NAD.

## 2021-06-27 NOTE — ED Provider Notes (Signed)
[facility]  ____________________________________________   Event Date/Time   First MD Initiated Contact with Patient 06/27/21 1800     (approximate)  I have reviewed the triage vital signs and the nursing notes.   HISTORY  Chief Complaint Exposure to STD    HPI Paula Strickland is a 25 y.o. female past medical history of seizure disorder who presents for STD check.  Patient sexual partner is having penile discharge so she is concerned about potential STD.  Patient is currently asymptomatic denies abnormal vaginal discharge pain burning with urination abdominal pain fevers chills etc.  Patient is interested in testing for HIV and syphilis as well.         Past Medical History:  Diagnosis Date  . Normal delivery   . Seizures ([facility]) 33-11 years old    Patient Active Problem List   Diagnosis Date Noted  . Alcohol use disorder, mild, abuse 05/22/2020  . Cannabis use disorder, severe, dependence ([facility]) 05/22/2020  . Cocaine use disorder, severe, dependence ([facility]) 05/22/2020  . Severe episode of recurrent major depressive disorder, without psychotic features ([facility]) 05/22/2020  . Labor and delivery, indication for care 03/01/2016  . Maternal anemia in pregnancy, antepartum, third trimester 01/04/2016  . First trimester screening 08/24/2015    Past Surgical History:  Procedure Laterality Date  . THROAT SURGERY  25 years old   pt unsure of procedure that was performed    Prior to Admission medications   Medication Sig Start Date End Date Taking? Authorizing Provider  albuterol (VENTOLIN HFA) 108 (90 Base) MCG/ACT inhaler Inhale 1-2 puffs into the lungs every 6 (six) hours as needed for wheezing or shortness of breath. 06/23/21   Shirlee Latch, PA-C  benzonatate (TESSALON) 200 MG capsule Take 1 capsule (200 mg total) by mouth 3 (three) times daily as needed for cough. 06/23/21   Shirlee Latch, PA-C  dicyclomine (BENTYL) 10 MG capsule Take 1 capsule (10 mg  total) by mouth 3 (three) times daily as needed for up to 7 days for spasms. 02/08/21 02/15/21  Shaune Pollack, MD  doxycycline (VIBRAMYCIN) 100 MG capsule Take 1 capsule (100 mg total) by mouth 2 (two) times daily for 7 days. 06/23/21 06/30/21  Shirlee Latch, PA-C  hyoscyamine (ANASPAZ) 0.125 MG TBDP disintergrating tablet Place 1 tablet (0.125 mg total) under the tongue every 4 (four) hours as needed for up to 3 days for cramping. 02/12/20 02/15/20  Dionne Bucy, MD  medroxyPROGESTERone (DEPO-PROVERA) 150 MG/ML injection Inject 1 mL (150 mg total) into the muscle once. 03/04/16 06/29/16  Sharee Pimple, CNM  predniSONE (DELTASONE) 20 MG tablet Take 2 tablets (40 mg total) by mouth daily for 5 days. 06/23/21 06/28/21  Eusebio Friendly B, PA-C  sertraline (ZOLOFT) 50 MG tablet Take 50 mg by mouth every morning. 11/10/20   [provider]  traZODone (DESYREL) 100 MG tablet Take 100 mg by mouth at bedtime as needed. 11/10/20   [provider]  valACYclovir (VALTREX) 500 MG tablet Take by mouth. 11/10/20   [provider]    Allergies Patient has no known allergies.  Family History  Problem Relation Age of Onset  . Migraines Mother   . Hypertension Mother     Social History Social History   Tobacco Use  . Smoking status: Never  . Smokeless tobacco: Never  Substance Use Topics  . Alcohol use: No  . Drug use: No    Review of Systems   Review of Systems  Constitutional:  Negative for chills and fever.  Gastrointestinal:  Negative for abdominal pain.  Genitourinary:  Negative for difficulty urinating, dysuria, pelvic pain and vaginal discharge.  All other systems reviewed and are negative.  Physical Exam Updated Vital Signs BP 120/68 (BP Location: Left Arm)   Pulse 94   Temp 98.1 F (36.7 C) (Oral)   Resp 17   Ht 5\' 3"  (1.6 m)   Wt 83.9 kg   LMP 06/21/2021 (Approximate)   SpO2 97%   BMI 32.77 kg/m   Physical Exam Vitals and nursing note reviewed.   Constitutional:      General: She is not in acute distress.    Appearance: Normal appearance.  HENT:     Head: Normocephalic and atraumatic.  Eyes:     General: No scleral icterus.    Conjunctiva/sclera: Conjunctivae normal.  Pulmonary:     Effort: Pulmonary effort is normal. No respiratory distress.     Breath sounds: No stridor.  Musculoskeletal:        General: No deformity or signs of injury.     Cervical back: Normal range of motion.  Skin:    General: Skin is dry.     Coloration: Skin is not jaundiced or pale.  Neurological:     General: No focal deficit present.     Mental Status: She is alert and oriented to person, place, and time. Mental status is at baseline.  Psychiatric:        Mood and Affect: Mood normal.        Behavior: Behavior normal.     LABS (all labs ordered are listed, but only abnormal results are displayed)  Labs Reviewed - No data to display ____________________________________________  EKG  N/a ____________________________________________  RADIOLOGY 13/05/2021, personally viewed and evaluated these images (plain radiographs) as part of my medical decision making, as well as reviewing the written report by the radiologist.  ED MD interpretation:  n/a    ____________________________________________   PROCEDURES  Procedure(s) performed (including Critical Care):  Procedures   ____________________________________________   INITIAL IMPRESSION / ASSESSMENT AND PLAN / ED COURSE     25 year old female presents for STD check.  She is asymptomatic but has a partner with significant symptoms.  She is well-appearing.  Will treat empirically for gonorrhea and chlamydia, GC chlamydia test sent off the urine as well as HIV and syphilis.  He was given IM ceftriaxone and prescription for doxycycline.  We discussed refraining from intercourse until she and her partner are fully treated.  Clinical Course as of 06/27/21 1826  Wed Jun 27, 2021  1826 Preg Test, Ur: NEGATIVE [KM]    Clinical Course User Index [KM] 12-23[birth year]1990, MD     ____________________________________________   FINAL CLINICAL IMPRESSION(S) / ED DIAGNOSES  Final diagnoses:  None     ED Discharge Orders     None        Note:  This document was prepared using Dragon voice recognition software and may include unintentional dictation errors.    Georga Hacking, MD 06/27/21 [phone]

## 2021-06-27 NOTE — Discharge Instructions (Addendum)
We sent tests to check you for gonorrhea, chlamydia, herpes and syphilis.  We have given you the treatment for gonorrhea and chlamydia given your partner is symptomatic.  This includes 1 shot and an antibiotic twice daily for the next 7 days.  Please do not have sexual intercourse until you finish the course of antibiotics.

## 2021-06-27 NOTE — ED Notes (Signed)
Pt states that she hasn't been having any symptoms but that her partner that she is sleeping with is having symptoms, states that the head of his penis is swollen and has "stuff" coming out of it. Pt states that she just recently got out of a relationship and so did he, so she is uncertain if either of them had something to begin with. Pt denies any discharge, abd pain, painful urination

## 2021-06-28 LAB — RPR: RPR Ser Ql: NONREACTIVE

## 2021-07-03 ENCOUNTER — Ambulatory Visit: Payer: Medicaid Other

## 2023-01-16 ENCOUNTER — Ambulatory Visit
Admission: EM | Admit: 2023-01-16 | Discharge: 2023-01-16 | Disposition: A | Discharge status: Home / Self Care | Payer: Medicaid Other | Attending: Family Medicine | Admitting: Family Medicine

## 2023-01-16 ENCOUNTER — Encounter: Payer: Self-pay | Admitting: Emergency Medicine

## 2023-01-16 DIAGNOSIS — N3000 Acute cystitis without hematuria: Secondary | ICD-10-CM | POA: Diagnosis not present

## 2023-01-16 LAB — URINALYSIS, W/ REFLEX TO CULTURE (INFECTION SUSPECTED)
Bilirubin Urine: NEGATIVE
Glucose, UA: NEGATIVE mg/dL
Hgb urine dipstick: NEGATIVE
Ketones, ur: NEGATIVE mg/dL
Nitrite: NEGATIVE
Protein, ur: NEGATIVE mg/dL
Specific Gravity, Urine: 1.025 (ref 1.005–1.030)
pH: 6 (ref 5.0–8.0)

## 2023-01-16 MED ORDER — NITROFURANTOIN MONOHYD MACRO 100 MG PO CAPS: 100.0000 mg | ORAL_CAPSULE | Freq: Two times a day (BID) | ORAL | 0 refills | Status: AC

## 2023-01-16 NOTE — Discharge Instructions (Addendum)
You had some evidence of a urinary tract infection. We discussed waiting for culture or just treating. Stop by the pharmacy to pick your antibiotics.  If your symptoms do not resolve, you have fever or vaginal discharge, return to the urgent care.

## 2023-01-16 NOTE — ED Provider Notes (Signed)
[facility]    CSN: [id number] Arrival date & time: 01/16/23  1628      History   Chief Complaint Chief Complaint  Patient presents with   Dysuria   Urinary Frequency     HPI HPI Paula Strickland is a 27 y.o. female.    Paula Strickland presents for dysuria and urinary frequency for the past 3 days.  Tried nothing prior to arrival.  Has  not had any antibiotics in last 30 days.   Denies known STI exposure.  Paula Strickland does not use condoms regularly. She is  not currently pregnant.  Patient's last menstrual period was 12/28/2022.    - Abnormal vaginal discharge: no - vaginal odor: no - vaginal bleeding: no - Dysuria: yes - Hematuria: no - Urinary urgency:no  - Urinary frequency: yes   - Fever: no - Abdominal pain: no  - Pelvic pain: no - Rash/Skin lesions/mouth ulcers: no - Nausea: no  - Vomiting: no  - Back Pain: no        Past Medical History:  Diagnosis Date   Normal delivery    Seizures ([facility]) 84-49 years old    Patient Active Problem List   Diagnosis Date Noted   Alcohol use disorder, mild, abuse 05/22/2020   Cannabis use disorder, severe, dependence ([facility]) 05/22/2020   Cocaine use disorder, severe, dependence ([facility]) 05/22/2020   Severe episode of recurrent major depressive disorder, without psychotic features ([facility]) 05/22/2020   Labor and delivery, indication for care 03/01/2016   Maternal anemia in pregnancy, antepartum, third trimester 01/04/2016   First trimester screening 08/24/2015    Past Surgical History:  Procedure Laterality Date   THROAT SURGERY  27 years old   pt unsure of procedure that was performed    OB History     Gravida  1   Para  1   Term  1   Preterm      AB      Living  1      SAB      IAB      Ectopic      Multiple  0   Live Births  1        Obstetric Comments  Pt. Here for IOL.           Home Medications    Prior to Admission medications   Medication Sig Start Date End Date Taking?  Authorizing Provider  divalproex (DEPAKOTE ER) 500 MG 24 hr tablet Take 500 mg by mouth 2 (two) times daily. 12/30/22  Yes [provider]  nitrofurantoin, macrocrystal-monohydrate, (MACROBID) 100 MG capsule Take 1 capsule (100 mg total) by mouth 2 (two) times daily. 01/16/23  Yes Billie Intriago, DO  sertraline (ZOLOFT) 50 MG tablet Take 50 mg by mouth every morning. 11/10/20  Yes [provider]  albuterol (VENTOLIN HFA) 108 (90 Base) MCG/ACT inhaler Inhale 1-2 puffs into the lungs every 6 (six) hours as needed for wheezing or shortness of breath. 06/23/21   Shirlee Latch, PA-C  benzonatate (TESSALON) 200 MG capsule Take 1 capsule (200 mg total) by mouth 3 (three) times daily as needed for cough. 06/23/21   Shirlee Latch, PA-C  dicyclomine (BENTYL) 10 MG capsule Take 1 capsule (10 mg total) by mouth 3 (three) times daily as needed for up to 7 days for spasms. 02/08/21 02/15/21  Shaune Pollack, MD  hyoscyamine (ANASPAZ) 0.125 MG TBDP disintergrating tablet Place 1 tablet (0.125 mg total) under the tongue every 4 (four)  hours as needed for up to 3 days for cramping. 02/12/20 02/15/20  Dionne Bucy, MD  medroxyPROGESTERone (DEPO-PROVERA) 150 MG/ML injection Inject 1 mL (150 mg total) into the muscle once. 03/04/16 06/29/16  Sharee Pimple, CNM  traZODone (DESYREL) 100 MG tablet Take 100 mg by mouth at bedtime as needed. 11/10/20   [provider]  valACYclovir (VALTREX) 500 MG tablet Take by mouth. 11/10/20   [provider]    Family History Family History  Problem Relation Age of Onset   Migraines Mother    Hypertension Mother     Social History Social History   Tobacco Use   Smoking status: Never   Smokeless tobacco: Never  Vaping Use   Vaping Use: Never used  Substance Use Topics   Alcohol use: Yes    Comment: social   Drug use: No     Allergies   Patient has no known allergies.   Review of Systems Review of Systems: :negative unless otherwise  stated in HPI.      Physical Exam Triage Vital Signs ED Triage Vitals  Enc Vitals Group     BP 01/16/23 1638 118/62     Pulse Rate 01/16/23 1638 86     Resp 01/16/23 1638 16     Temp 01/16/23 1638 98.2 F (36.8 C)     Temp Source 01/16/23 1638 Oral     SpO2 01/16/23 1638 98 %     Weight --      Height --      Head Circumference --      Peak Flow --      Pain Score 01/16/23 1636 0     Pain Loc --      Pain Edu? --      Excl. in GC? --    No data found.  Updated Vital Signs BP 118/62 (BP Location: Left Arm)   Pulse 86   Temp 98.2 F (36.8 C) (Oral)   Resp 16   LMP 12/28/2022   SpO2 98%   Visual Acuity Right Eye Distance:   Left Eye Distance:   Bilateral Distance:    Right Eye Near:   Left Eye Near:    Bilateral Near:     Physical Exam GEN: well appearing female in no acute distress  CVS: well perfused  RESP: speaking in full sentences without pause     UC Treatments / Results  Labs (all labs ordered are listed, but only abnormal results are displayed) Labs Reviewed  URINALYSIS, W/ REFLEX TO CULTURE (INFECTION SUSPECTED) - Abnormal; Notable for the following components:      Result Value   Leukocytes,Ua TRACE (*)    Bacteria, UA FEW (*)    All other components within normal limits    EKG   Radiology No results found.  Procedures Procedures (including critical care time)  Medications Ordered in UC Medications - No data to display  Initial Impression / Assessment and Plan / UC Course  I have reviewed the triage vital signs and the nursing notes.  Pertinent labs & imaging results that were available during my care of the patient were reviewed by me and considered in my medical decision making (see chart for details).       Patient is a 27 y.o. female  who presents for 3 days of dysuria and urinary frequency.  Overall patient is well-appearing and afebrile.  Vital signs stable.  UA consistent with possible acute cystitis.  No hematuria to  suggest acute  kidney stone.  She is not having any vaginal discharge and declines wet prep and STD testing today.  Treat with Macrobid 2 times daily for 5 days. Return precautions including abdominal pain, fever, chills, nausea, or vomiting given. Follow-up,  if symptoms not improving or getting worse. Discussed MDM, treatment plan and plan for follow-up with patient who agrees with plan.        Final Clinical Impressions(s) / UC Diagnoses   Final diagnoses:  Acute cystitis without hematuria     Discharge Instructions      You had some evidence of a urinary tract infection. We discussed waiting for culture or just treating. Stop by the pharmacy to pick your antibiotics.  If your symptoms do not resolve, you have fever or vaginal discharge, return to the urgent care.     ED Prescriptions     Medication Sig Dispense Auth. Provider   nitrofurantoin, macrocrystal-monohydrate, (MACROBID) 100 MG capsule Take 1 capsule (100 mg total) by mouth 2 (two) times daily. 10 capsule Katha Cabal, DO      PDMP not reviewed this encounter.   Katha Cabal, DO 01/16/23 1711

## 2023-01-16 NOTE — ED Triage Notes (Signed)
Pt c/o dysuria and urinary frequency x 3 days.
